# Patient Record
Sex: Female | Born: 1960 | Race: White | Hispanic: No | Marital: Married | Smoking: Current every day smoker
Health system: Southern US, Community
[De-identification: ages and names within clinical notes are randomized; demographics above are authoritative.]

## PROBLEM LIST (undated history)

## (undated) DIAGNOSIS — Z789 Other specified health status: Secondary | ICD-10-CM

---

## 1998-05-06 ENCOUNTER — Other Ambulatory Visit: Admission: RE | Admit: 1998-05-06 | Discharge: 1998-05-06 | Payer: Self-pay | Admitting: Family Medicine

## 2000-02-10 ENCOUNTER — Emergency Department (HOSPITAL_COMMUNITY): Admission: EM | Admit: 2000-02-10 | Discharge: 2000-02-10 | Payer: Self-pay | Admitting: Emergency Medicine

## 2000-02-10 ENCOUNTER — Encounter: Payer: Self-pay | Admitting: Emergency Medicine

## 2000-04-14 ENCOUNTER — Emergency Department (HOSPITAL_COMMUNITY): Admission: EM | Admit: 2000-04-14 | Discharge: 2000-04-14 | Payer: Self-pay | Admitting: Emergency Medicine

## 2000-04-14 ENCOUNTER — Encounter: Payer: Self-pay | Admitting: Emergency Medicine

## 2002-02-01 ENCOUNTER — Encounter: Payer: Self-pay | Admitting: Family Medicine

## 2002-02-01 ENCOUNTER — Encounter: Admission: RE | Admit: 2002-02-01 | Discharge: 2002-02-01 | Payer: Self-pay | Admitting: Family Medicine

## 2004-09-30 ENCOUNTER — Emergency Department (HOSPITAL_COMMUNITY): Admission: EM | Admit: 2004-09-30 | Discharge: 2004-09-30 | Payer: Self-pay | Admitting: Emergency Medicine

## 2006-09-27 ENCOUNTER — Emergency Department (HOSPITAL_COMMUNITY): Admission: EM | Admit: 2006-09-27 | Discharge: 2006-09-27 | Payer: Self-pay | Admitting: Emergency Medicine

## 2006-10-12 ENCOUNTER — Ambulatory Visit: Payer: Self-pay | Admitting: General Practice

## 2007-01-05 ENCOUNTER — Encounter: Admission: RE | Admit: 2007-01-05 | Discharge: 2007-01-05 | Payer: Self-pay | Admitting: Occupational Medicine

## 2007-09-07 ENCOUNTER — Other Ambulatory Visit: Admission: RE | Admit: 2007-09-07 | Discharge: 2007-09-07 | Payer: Self-pay | Admitting: Family Medicine

## 2007-10-13 IMAGING — CR DG FOOT COMPLETE 3+V*R*
3 series · 3 of 3 positions shown · non-contrast
Comparison: None.

CLINICAL DATA: Pain in the plantar aspect of the first metatarsal after pushing hard on a pedal. 
 RIGHT FOOT ? 3 VIEW:

[view not recorded (1 of 3)]
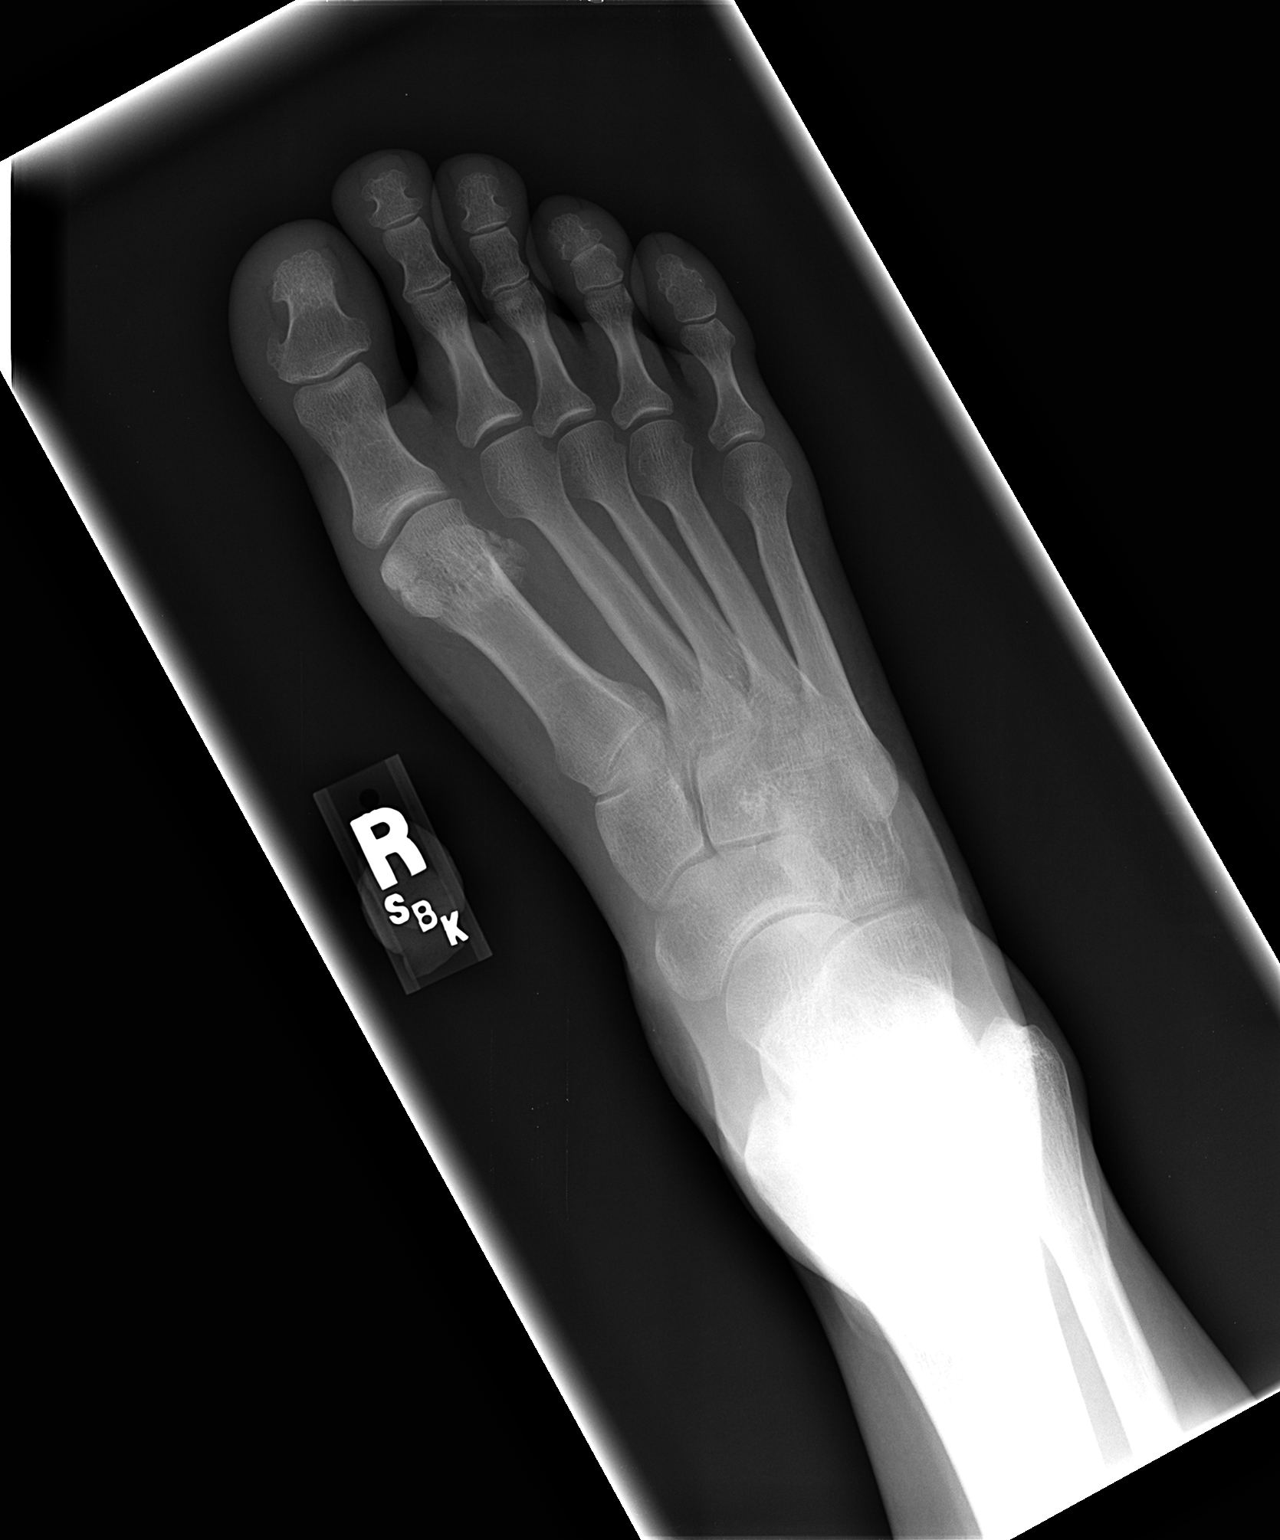

[view not recorded (2 of 3)]
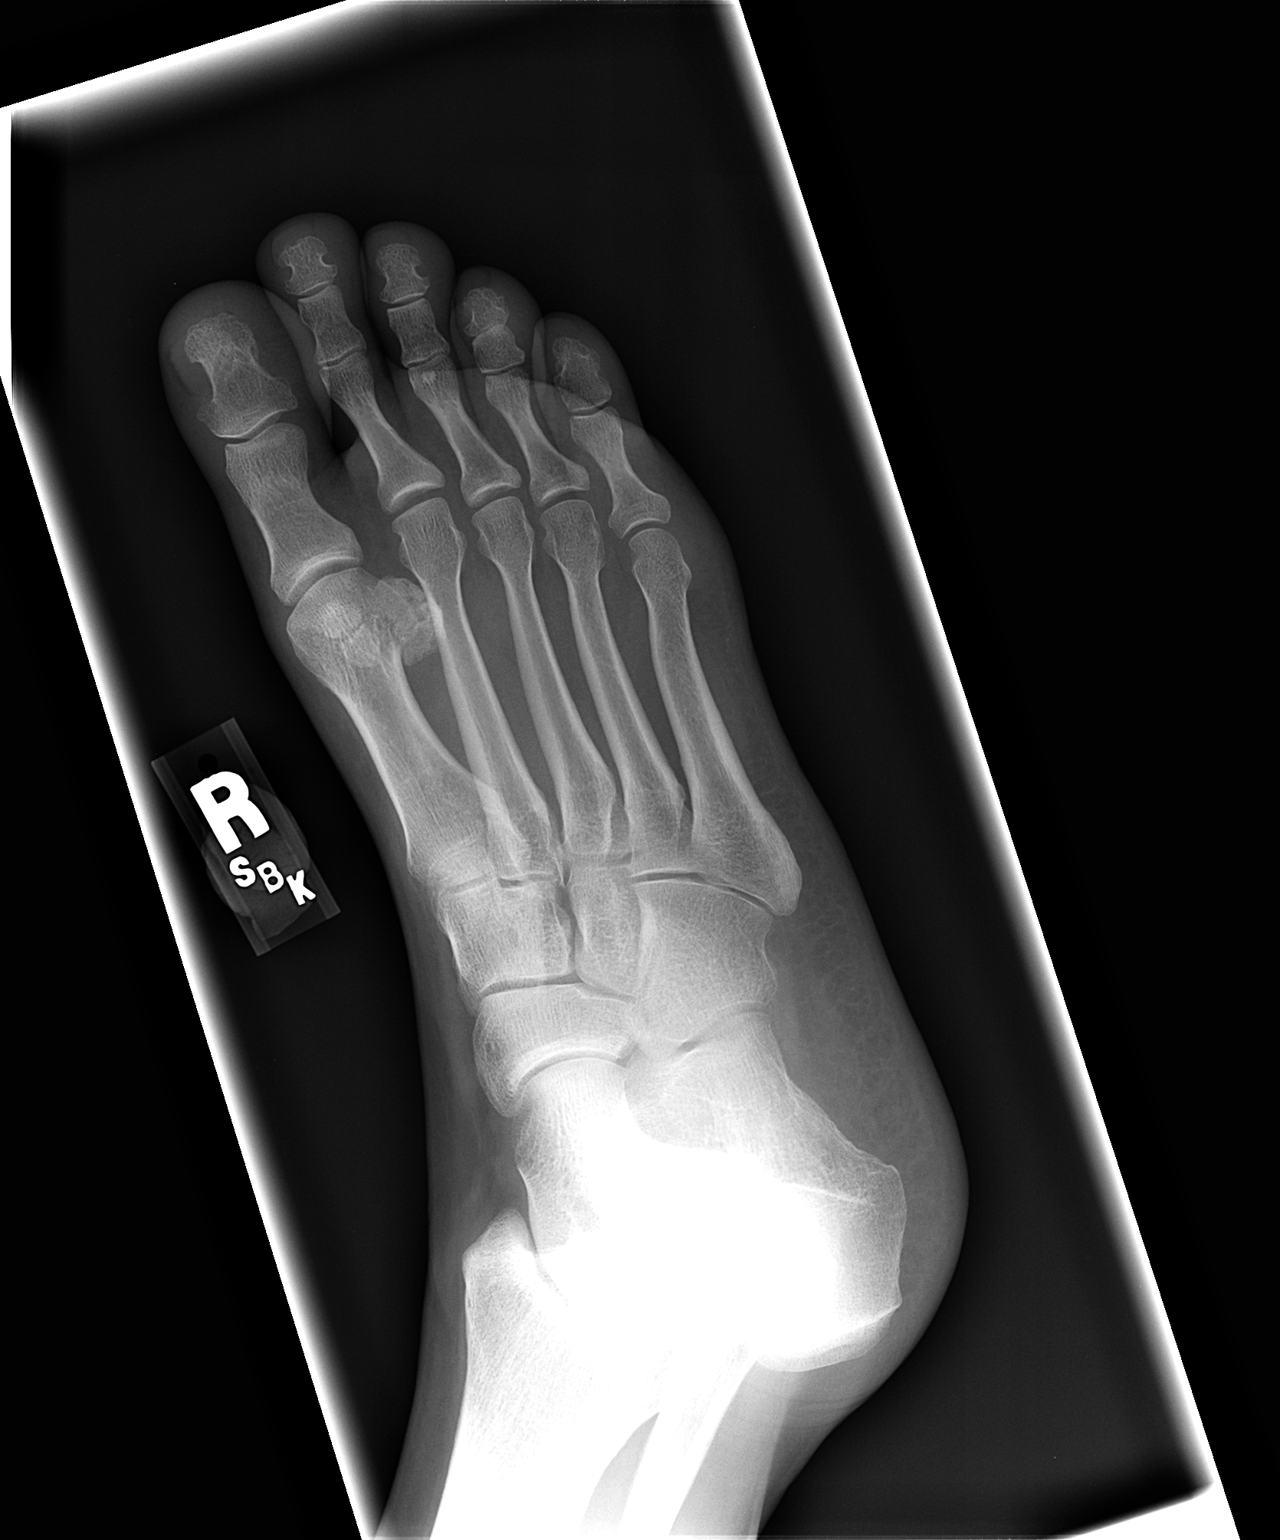

[view not recorded (3 of 3)]
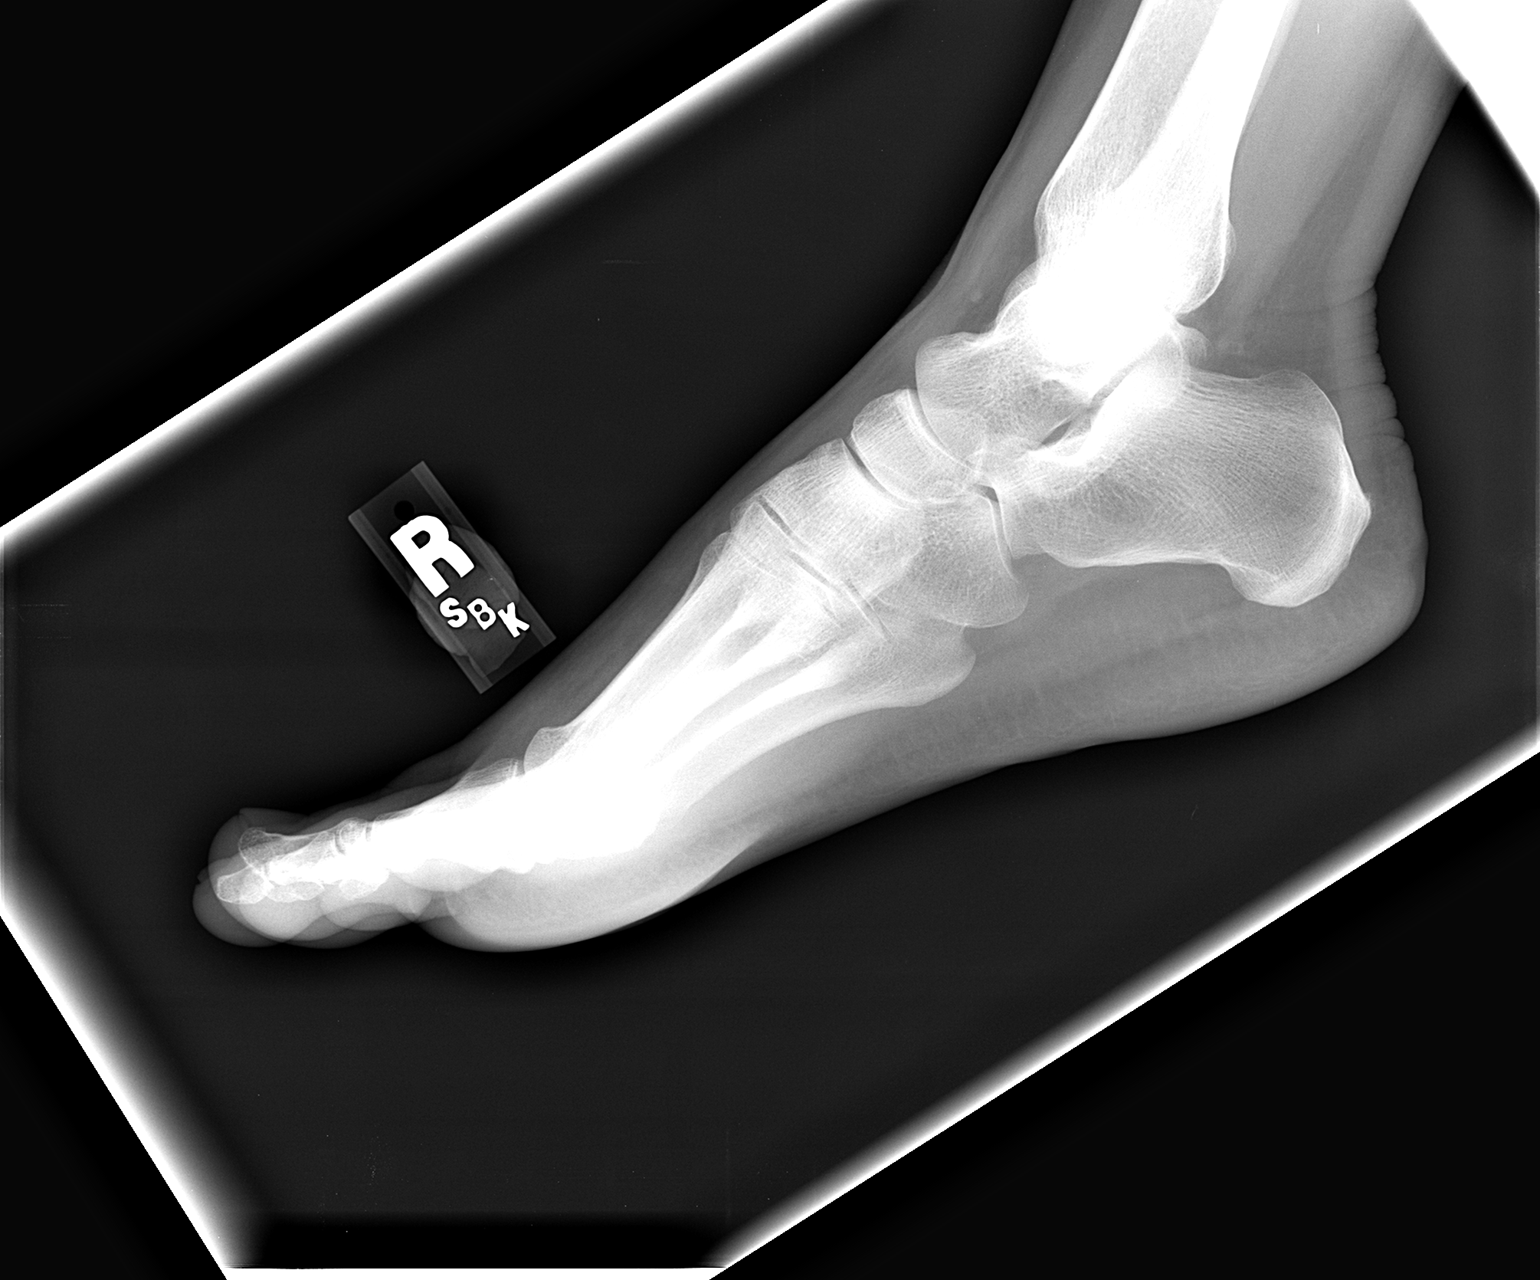

[3 of 3 positions shown; findings below may reference images not displayed]

FINDINGS: No fracture or acute abnormality.  The sesamoid bones at the base of the first metatarsal are somewhat fragments and more prominent than usual, but this is a variant of normal.
IMPRESSION: No acute findings ? see report.

## 2007-10-17 ENCOUNTER — Ambulatory Visit: Payer: Self-pay | Admitting: General Practice

## 2007-10-20 ENCOUNTER — Ambulatory Visit: Payer: Self-pay | Admitting: General Practice

## 2008-01-30 ENCOUNTER — Encounter: Admission: RE | Admit: 2008-01-30 | Discharge: 2008-01-30 | Payer: Self-pay | Admitting: Family Medicine

## 2008-06-24 ENCOUNTER — Ambulatory Visit: Payer: Self-pay | Admitting: General Practice

## 2009-08-20 ENCOUNTER — Emergency Department (HOSPITAL_COMMUNITY): Admission: EM | Admit: 2009-08-20 | Discharge: 2009-08-20 | Payer: Self-pay | Admitting: Family Medicine

## 2009-10-28 ENCOUNTER — Ambulatory Visit: Payer: Self-pay | Admitting: General Practice

## 2012-08-13 DIAGNOSIS — R4789 Other speech disturbances: Secondary | ICD-10-CM | POA: Insufficient documentation

## 2012-08-13 DIAGNOSIS — I498 Other specified cardiac arrhythmias: Secondary | ICD-10-CM | POA: Insufficient documentation

## 2012-08-13 DIAGNOSIS — F17200 Nicotine dependence, unspecified, uncomplicated: Secondary | ICD-10-CM | POA: Insufficient documentation

## 2012-08-13 DIAGNOSIS — I6350 Cerebral infarction due to unspecified occlusion or stenosis of unspecified cerebral artery: Principal | ICD-10-CM | POA: Insufficient documentation

## 2012-08-13 DIAGNOSIS — E119 Type 2 diabetes mellitus without complications: Secondary | ICD-10-CM | POA: Insufficient documentation

## 2012-08-13 DIAGNOSIS — I959 Hypotension, unspecified: Secondary | ICD-10-CM | POA: Insufficient documentation

## 2012-08-14 ENCOUNTER — Observation Stay (HOSPITAL_COMMUNITY)
Admission: EM | Admit: 2012-08-14 | Discharge: 2012-08-15 | DRG: 014 | Disposition: A | Discharge status: Home / Self Care | Payer: BC Managed Care – PPO | Attending: Family Medicine | Admitting: Family Medicine

## 2012-08-14 ENCOUNTER — Emergency Department (HOSPITAL_COMMUNITY): Payer: BC Managed Care – PPO

## 2012-08-14 ENCOUNTER — Encounter (HOSPITAL_COMMUNITY): Payer: Self-pay | Admitting: *Deleted

## 2012-08-14 ENCOUNTER — Observation Stay (HOSPITAL_COMMUNITY): Payer: BC Managed Care – PPO

## 2012-08-14 DIAGNOSIS — R001 Bradycardia, unspecified: Secondary | ICD-10-CM

## 2012-08-14 DIAGNOSIS — E119 Type 2 diabetes mellitus without complications: Secondary | ICD-10-CM

## 2012-08-14 DIAGNOSIS — I639 Cerebral infarction, unspecified: Secondary | ICD-10-CM

## 2012-08-14 DIAGNOSIS — I951 Orthostatic hypotension: Secondary | ICD-10-CM

## 2012-08-14 DIAGNOSIS — I6350 Cerebral infarction due to unspecified occlusion or stenosis of unspecified cerebral artery: Principal | ICD-10-CM

## 2012-08-14 DIAGNOSIS — G459 Transient cerebral ischemic attack, unspecified: Secondary | ICD-10-CM

## 2012-08-14 DIAGNOSIS — F17200 Nicotine dependence, unspecified, uncomplicated: Secondary | ICD-10-CM

## 2012-08-14 HISTORY — DX: Other specified health status: Z78.9

## 2012-08-14 LAB — LIPID PANEL
HDL: 58 mg/dL (ref >39)
LDL Cholesterol: 68 mg/dL (ref 0–99)
Total CHOL/HDL Ratio: 2.7 RATIO
Triglycerides: 148 mg/dL (ref <150)
VLDL: 30 mg/dL (ref 0–40)

## 2012-08-14 LAB — COMPREHENSIVE METABOLIC PANEL
ALT: 16 U/L (ref 0–35)
AST: 13 U/L (ref 0–37)
Albumin: 3.6 g/dL (ref 3.5–5.2)
Alkaline Phosphatase: 69 U/L (ref 39–117)
BUN: 16 mg/dL (ref 6–23)
CO2: 25 mEq/L (ref 19–32)
Calcium: 9.8 mg/dL (ref 8.4–10.5)
Chloride: 106 mEq/L (ref 96–112)
Creatinine, Ser: 0.57 mg/dL (ref 0.50–1.10)
GFR calc Af Amer: >90 mL/min (ref >90)
GFR calc non Af Amer: >90 mL/min (ref >90)
Glucose, Bld: 287 mg/dL — ABNORMAL HIGH (ref 70–99)
Potassium: 3.6 mEq/L (ref 3.5–5.1)
Sodium: 141 mEq/L (ref 135–145)
Total Bilirubin: 0.1 mg/dL — ABNORMAL LOW (ref 0.3–1.2)
Total Protein: 6.5 g/dL (ref 6.0–8.3)

## 2012-08-14 LAB — CBC
HCT: 38.7 % (ref 36.0–46.0)
HCT: 41 % (ref 36.0–46.0)
Hemoglobin: 13.3 g/dL (ref 12.0–15.0)
MCH: 30 pg (ref 26.0–34.0)
MCH: 29.7 pg (ref 26.0–34.0)
MCH: 29.2 pg (ref 26.0–34.0)
MCHC: 34.4 g/dL (ref 30.0–36.0)
MCHC: 33.9 g/dL (ref 30.0–36.0)
MCHC: 33.4 g/dL (ref 30.0–36.0)
MCV: 87.2 fL (ref 78.0–100.0)
MCV: 87.8 fL (ref 78.0–100.0)
Platelets: 257 10*3/uL (ref 150–400)
Platelets: 249 10*3/uL (ref 150–400)
RBC: 4.44 MIL/uL (ref 3.87–5.11)
RDW: 12.4 % (ref 11.5–15.5)
RDW: 12.6 % (ref 11.5–15.5)
WBC: 9.7 10*3/uL (ref 4.0–10.5)

## 2012-08-14 LAB — GLUCOSE, CAPILLARY
Glucose-Capillary: 216 mg/dL — ABNORMAL HIGH (ref 70–99)
Glucose-Capillary: 141 mg/dL — ABNORMAL HIGH (ref 70–99)
Glucose-Capillary: 284 mg/dL — ABNORMAL HIGH (ref 70–99)

## 2012-08-14 LAB — PROTIME-INR
INR: 0.91 (ref 0.00–1.49)
Prothrombin Time: 12.4 seconds (ref 11.6–15.2)

## 2012-08-14 LAB — CREATININE, SERUM: Creatinine, Ser: 0.61 mg/dL (ref 0.50–1.10)

## 2012-08-14 LAB — HEMOGLOBIN A1C: Hgb A1c MFr Bld: 8.7 % — ABNORMAL HIGH (ref <5.7)

## 2012-08-14 LAB — TROPONIN I: Troponin I: <0.3 ng/mL (ref <0.30)

## 2012-08-14 MED ORDER — ONDANSETRON HCL 4 MG/2ML IJ SOLN: 4.0000 mg | Freq: Four times a day (QID) | INTRAMUSCULAR | Status: DC | PRN

## 2012-08-14 MED ORDER — ACETAMINOPHEN 325 MG PO TABS: 650.0000 mg | ORAL_TABLET | Freq: Four times a day (QID) | ORAL | Status: DC | PRN

## 2012-08-14 MED ORDER — ASPIRIN 81 MG PO CHEW
81.0000 mg | CHEWABLE_TABLET | Freq: Once | ORAL | Status: AC
  Administered 2012-08-14: 81 mg via ORAL
  Filled 2012-08-14: qty 1

## 2012-08-14 MED ORDER — INSULIN ASPART 100 UNIT/ML ~~LOC~~ SOLN
0.0000 [IU] | Freq: Three times a day (TID) | SUBCUTANEOUS | Status: DC
  Administered 2012-08-15: 1 [IU] via SUBCUTANEOUS
  Administered 2012-08-15: 2 [IU] via SUBCUTANEOUS

## 2012-08-14 MED ORDER — SODIUM CHLORIDE 0.9 % IV SOLN: 250.0000 mL | INTRAVENOUS | Status: DC | PRN

## 2012-08-14 MED ORDER — SODIUM CHLORIDE 0.9 % IJ SOLN
3.0000 mL | Freq: Two times a day (BID) | INTRAMUSCULAR | Status: DC
  Administered 2012-08-15: 3 mL via INTRAVENOUS

## 2012-08-14 MED ORDER — METFORMIN HCL 500 MG PO TABS
500.0000 mg | ORAL_TABLET | Freq: Two times a day (BID) | ORAL | Status: DC
  Administered 2012-08-15 (×2): 500 mg via ORAL
  Filled 2012-08-14 (×4): qty 1

## 2012-08-14 MED ORDER — ASPIRIN 81 MG PO CHEW
81.0000 mg | CHEWABLE_TABLET | Freq: Every day | ORAL | Status: DC
  Administered 2012-08-14 – 2012-08-15 (×2): 81 mg via ORAL
  Filled 2012-08-14 (×2): qty 1

## 2012-08-14 MED ORDER — ASPIRIN EC 81 MG PO TBEC
81.0000 mg | DELAYED_RELEASE_TABLET | Freq: Every day | ORAL | Status: DC
  Filled 2012-08-14: qty 1

## 2012-08-14 MED ORDER — SODIUM CHLORIDE 0.9 % IJ SOLN: 3.0000 mL | INTRAMUSCULAR | Status: DC | PRN

## 2012-08-14 MED ORDER — SENNOSIDES-DOCUSATE SODIUM 8.6-50 MG PO TABS: 1.0000 | ORAL_TABLET | Freq: Every evening | ORAL | Status: DC | PRN

## 2012-08-14 MED ORDER — ENOXAPARIN SODIUM 40 MG/0.4ML ~~LOC~~ SOLN
40.0000 mg | SUBCUTANEOUS | Status: DC
  Administered 2012-08-14: 40 mg via SUBCUTANEOUS
  Filled 2012-08-14 (×2): qty 0.4

## 2012-08-14 MED ORDER — ASPIRIN 81 MG PO CHEW
324.0000 mg | CHEWABLE_TABLET | Freq: Once | ORAL | Status: DC
Start: 2012-08-14 — End: 2012-08-14

## 2012-08-14 MED ORDER — ENOXAPARIN SODIUM 40 MG/0.4ML ~~LOC~~ SOLN
40.0000 mg | SUBCUTANEOUS | Status: DC
  Administered 2012-08-14 – 2012-08-15 (×2): 40 mg via SUBCUTANEOUS
  Filled 2012-08-14 (×2): qty 0.4

## 2012-08-14 NOTE — Consult Note (Signed)
Chief Complaint: Stroke  HPI: Stephanie Fuller is a 51 y.o. RH  Female originally from [place], on Sunday  Noticed she noted weakness on the right UE. That night her husband noted dysarthria and brought patient to ED.  Since admission patients BP has been systolic in 130's, and HR in 48-54 and asymptomatic. 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  Patient denied any chest pain, no sob, no sensory deficits, no dysphagia, no diplopia  Presently she is complaining of right arm and leg weakness but no sensory changes. Husband states her speech has improved but is not back to baseline. She does smoke 1.5 PPD.    LSN: Sunday tPA Given: No: out of window  History reviewed. No pertinent past medical history.  Past Surgical History  Procedure Date  . Cesarean section     History reviewed. No pertinent family history. Social History:  reports that she has been smoking.  She does smoke tobacco (1.5 PPD). She reports that she drinks about .6 ounces of alcohol per week. Her drug history not on file.  Family history: Father died from Hypertensive Intracerebral bleed and mother died from Cardiac arrest Allergies: No Known Allergies  Medications: Current Facility-Administered Medications  Medication Dose Route Frequency Provider Last Rate Last Dose  . aspirin chewable tablet 324 mg  324 mg Oral Once [employer], PA-C      . aspirin EC tablet 81 mg  81 mg Oral Daily Felicie Morn, PA      . enoxaparin (LOVENOX) injection 40 mg  40 mg Subcutaneous Q24H Tobin Chad, MD   40 mg at 08/14/12 1020   Current Outpatient Prescriptions  Medication Sig Dispense Refill  . CALCIUM CARBONATE PO Take 1 tablet by mouth daily.      . Multiple Vitamins-Minerals (MULTI-VITAMIN GUMMIES) CHEW Chew 1 tablet by mouth daily.      . naproxen sodium (ANAPROX) 220 MG  tablet Take 220 mg by mouth daily as needed. For pain       ROS: History obtained from the patient  General ROS: negative for - chills, fatigue, fever, night sweats, weight gain or weight loss Psychological ROS: negative for - behavioral disorder, hallucinations, memory difficulties, mood swings or suicidal ideation Ophthalmic ROS: negative for - blurry vision, double vision, eye pain or loss of vision ENT ROS: negative for - epistaxis, nasal discharge, oral lesions, sore throat, tinnitus or vertigo Allergy and Immunology ROS: negative for - hives or itchy/watery eyes Hematological and Lymphatic ROS: negative for - bleeding problems, bruising or swollen lymph nodes Endocrine ROS: negative for - galactorrhea, hair pattern changes, polydipsia/polyuria or temperature intolerance Respiratory ROS: negative for - cough, hemoptysis, shortness of breath or wheezing Cardiovascular ROS: negative for - chest pain, dyspnea on exertion, edema or irregular heartbeat Gastrointestinal ROS: negative for - abdominal pain, diarrhea, hematemesis, nausea/vomiting or stool incontinence Genito-Urinary ROS: negative for - dysuria, hematuria, incontinence or urinary frequency/urgency Musculoskeletal ROS: negative for - joint swelling or muscular weakness Neurological ROS: as noted in HPI Dermatological ROS: negative for rash and skin lesion changes   Physical Examination: Blood pressure 119/87, pulse 55, temperature 98.1 F (36.7 C), temperature source Oral, resp. rate 16, SpO2 100.00%.  Neurologic Examination: Mental Status: Alert, oriented X 3.  Speech fluent without evidence of aphasia but per husband continues to have dysarthria. Able to follow 3 step commands without  difficulty. Mood:concerned Memory:Intact Thought content appropriate Cranial Nerves: II-Visual fields grossly intact. III/IV/VI-Extraocular movements intact.  Pupils reactive bilaterally. Ptosis not present. V/VII-Smile symmetric with  slight resting NL fold decrease VIII-grossly intact IX/X-normal gag XI-bilateral shoulder shrug XII-midline tongue extension Motor: 5/5 on left arm and leg,  Right: UE : Proximal  4+/5 and distal 5-/5             LE: proximal 4+ and distal is 5-/5 , Right leg drift   Sensory: Pinprick and light touch and vibration  intact throughout, bilaterally Deep Tendon Reflexes:  Right: Upper Extremity   Left: Upper extremity   biceps (C-5 to C-6) 2/4   biceps (C-5 to C-6) 2/4 tricep (C7) 2/4    triceps (C7) 2/4 Brachioradialis (C6) 2/4  Brachioradialis (C6) 2/4  Lower Extremity Lower Extremity  quadriceps (L-2 to L-4) 2/4   quadriceps (L-2 to L-4) 2/4 Achilles (S1) 1/4   Achilles (S1) 1/4      Plantars:      Right:  downgoing     Left:  Downgoing Cerebellar: Normal finger-to-nose and normal heel-to-shin test.      Laboratory Studies:  Basic Metabolic Panel:  Lab 08/14/12 8657 08/14/12 0235  NA -- 141  K -- 3.6  CL -- 106  CO2 -- 25  GLUCOSE -- 287*  BUN -- 16  CREATININE 0.61 0.57  CALCIUM -- 9.8  MG -- --  PHOS -- --    Liver Function Tests:  Lab 08/14/12 0235  AST 13  ALT 16  ALKPHOS 69  BILITOT 0.1*  PROT 6.5  ALBUMIN 3.6   No results found for this basename: LIPASE:5,AMYLASE:5 in the last 168 hours No results found for this basename: AMMONIA:3 in the last 168 hours  CBC:  Lab 08/14/12 0712 08/14/12 0235  WBC 8.6 9.7  NEUTROABS -- --  HGB 13.1 13.3  HCT 38.7 38.7  MCV 87.8 87.2  PLT 249 257    Cardiac Enzymes:  Lab 08/14/12 0235  CKTOTAL --  CKMB --  CKMBINDEX --  TROPONINI <0.30    BNP: No components found with this basename: POCBNP:5  CBG:  Lab 08/14/12 0438  GLUCAP 216*    Microbiology: No results found for this or any previous visit.  Coagulation Studies:  Basename 08/14/12 0235  LABPROT 12.4  INR 0.91    Urinalysis: No results found for this basename:  COLORURINE:2,APPERANCEUR:2,LABSPEC:2,PHURINE:2,GLUCOSEU:2,HGBUR:2,BILIRUBINUR:2,KETONESUR:2,PROTEINUR:2,UROBILINOGEN:2,NITRITE:2,LEUKOCYTESUR:2 in the last 168 hours  Lipid Panel:    Component Value Date/Time   CHOL 156 08/14/2012 0712   TRIG 148 08/14/2012 0712   HDL 58 08/14/2012 0712   CHOLHDL 2.7 08/14/2012 0712   VLDL 30 08/14/2012 0712   LDLCALC 68 08/14/2012 0712    HgbA1C:  No results found for this basename: HGBA1C    Urine Drug Screen:   No results found for this basename: labopia,  cocainscrnur,  labbenz,  amphetmu,  thcu,  labbarb    Alcohol Level: No results found for this basename: ETH:2 in the last 168 hours  Other results: EKG: normal EKG, normal sinus rhythm.  Imaging: Dg Chest 2 View  08/14/2012  *RADIOLOGY REPORT*  Clinical Data: Left shoulder pain and dizziness.  CHEST - 2 VIEW  Comparison: 01/30/2008  Findings: Normal heart size and pulmonary vascularity.  This changes in the chest.  No focal airspace consolidation.  No blunting of costophrenic angles.  No pneumothorax.  Mediastinal contours appear intact.  No significant change since previous study.  IMPRESSION: Emphysematous changes.  No evidence of  active pulmonary disease.  Original Report Authenticated By: Marlon Pel, M.D.   Ct Head Wo Contrast  08/14/2012  *RADIOLOGY REPORT*  Clinical Data: A aphasia.  Left arm pain and paresthesias. Dizziness.  CT HEAD WITHOUT CONTRAST  Technique:  Contiguous axial images were obtained from the base of the skull through the vertex without contrast.  Comparison: CT head 09/30/2004.  MRI brain 09/30/2004.  Findings: Mild cerebral atrophy.  No mass effect or midline shift. No abnormal extra-axial fluid collections.  Gray-white matter junctions are distinct.  Basal cisterns are not effaced.  No evidence of acute intracranial hemorrhage.  Mucous membrane thickening in the paranasal sinuses with retention cyst in the sphenoid sinus.  Mastoid air cells are not opacified.  No  depressed skull fractures.  IMPRESSION: No acute intracranial abnormalities.  Probable inflammatory changes in the paranasal sinuses.  Original Report Authenticated By: Marlon Pel, M.D.   Mr Brain Wo Contrast  08/14/2012  *RADIOLOGY REPORT*  Clinical Data:  Aphasia.  Left arm pain with dizziness.  MRI HEAD WITHOUT CONTRAST MRA HEAD WITHOUT CONTRAST  Technique:  Multiplanar, multiecho pulse sequences of the brain and surrounding structures were obtained without intravenous contrast. Angiographic images of the head were obtained using MRA technique without contrast.  Comparison:  CT head 08/14/2012.  MRI HEAD  Findings:  6 x 17 mm area of acute infarction affects the posterior limb internal capsule and basal ganglia on the left, as well as a portion of the dorsal lateral thalamus.  No associated hemorrhage or mass effect.  No mass lesion is seen.  There is no atrophy or significant white matter disease.  There is no midline shift.  The major intracranial vascular structures are patent.  There is chronic mucosal thickening throughout the paranasal sinuses with an air-fluid level in the left sphenoid division.  Calvarium and skull base intact.  Negative upper cervical region.  Pituitary and cerebellar tonsils unremarkable. Grossly negative orbits.  Compared with prior CT, the infarct is not visible.  IMPRESSION: Acute left hemisphere deep white matter infarction affects primarily the posterior limb internal capsule as well as portions of the surrounding basal ganglia and thalamus.  No hemorrhage or mass effect.  MRA HEAD  Findings: Widely patent internal carotid arteries.  Widely patent basilar artery.  Right greater than left vertebral arteries contribute formation of the basilar.  There is no proximal intracranial stenosis or aneurysm.  IMPRESSION: Negative MRA intracranial circulation.   Original Report Authenticated By: Elsie Stain, M.D. ( 08/14/2012 10:04:25 )    Mr Maxine Glenn Head/brain Wo Cm  08/14/2012   *RADIOLOGY REPORT*  Clinical Data:  Aphasia.  Left arm pain with dizziness.  MRI HEAD WITHOUT CONTRAST MRA HEAD WITHOUT CONTRAST  Technique:  Multiplanar, multiecho pulse sequences of the brain and surrounding structures were obtained without intravenous contrast. Angiographic images of the head were obtained using MRA technique without contrast.  Comparison:  CT head 08/14/2012.  MRI HEAD  Findings:  6 x 17 mm area of acute infarction affects the posterior limb internal capsule and basal ganglia on the left, as well as a portion of the dorsal lateral thalamus.  No associated hemorrhage or mass effect.  No mass lesion is seen.  There is no atrophy or significant white matter disease.  There is no midline shift.  The major intracranial vascular structures are patent.  There is chronic mucosal thickening throughout the paranasal sinuses with an air-fluid level in the left sphenoid division.  Calvarium and skull base  intact.  Negative upper cervical region.  Pituitary and cerebellar tonsils unremarkable. Grossly negative orbits.  Compared with prior CT, the infarct is not visible.  IMPRESSION: Acute left hemisphere deep white matter infarction affects primarily the posterior limb internal capsule as well as portions of the surrounding basal ganglia and thalamus.  No hemorrhage or mass effect.  MRA HEAD  Findings: Widely patent internal carotid arteries.  Widely patent basilar artery.  Right greater than left vertebral arteries contribute formation of the basilar.  There is no proximal intracranial stenosis or aneurysm.  IMPRESSION: Negative MRA intracranial circulation.   Original Report Authenticated By: Elsie Stain, M.D. ( 08/14/2012 10:04:24 )    CD show no ICA stenosis  Assessment: 51 y.o. 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 Likely etiology is small vessel  given risk factors of smoking and possible diabetes. Patient BP has remained in 130's and HR is bradycardic but she is asymptomatic Her symptoms are minimal No tPA was given because of the symptoms were so minimal and outside the tPA window.    Stroke Risk Factors - smoking  Plan: 1. HgbA1c, 2. PT consult, Speech consult prior to D/C 3. Echocardiogram 4. Prophylactic therapy-Antiplatelet med: Aspirin - dose 81 mg daily 7. Risk factor modification 8. Telemetry monitoring 9. Frequent neuro checks 10. Smoking cessation 11. Do not treat BP unless >210 systolic 12. Treat HbA1c if needed when resulted.  13. Orthostatic BP to confirm no symptoms from bradycardia.   Felicie Morn PA-C [facility] [phone]  08/14/2012, 12:04 PM     Patient is independently interviewed, examined and obtained history. Above note has been edited. i agree with the plan.  Patient  May have new onset of diabetes  Counseling: Advised patient to take ASA and quit smoking Most of the stroke work up is done and she needs Echo  In addition to outpatient Neurology follow up  Hussein Macdougal V-P Eilleen Kempf., MD., Ph.D.,MS 08/14/2012 12:33 PM

## 2012-08-14 NOTE — H&P (Signed)
Seen and examined.  Discussed with Dr. Fara Boros.  Agree with her documentation and management.  Briefly, 51 yo female with near syncope and difficulty with speech.  Issues: 1. Acute CVA.  Risk factors are tobacco use and uncontrolled DM.  Currently only affecting speech.   2. Diabetes Type 2 new onset.  She is previously undiagnosed and suspect this has been going on for a while. 3. Tobacco abuse. 4. Borderline hypotension and bradycardia.  CNS problems can cause cardiac arrythmias.  Also, will get echocardiogram as part of our CVA workup.  Still, it is odd to see hypotension in the setting of an acute CVA.  Will follow both heart rate and BP.  No intervention for now.

## 2012-08-14 NOTE — ED Provider Notes (Signed)
History     CSN: [id number]  Arrival date & time 08/13/12  2359   First MD Initiated Contact with Patient 08/14/12 0206      Chief Complaint  Patient presents with  . Arm Pain  . Dizziness    (Consider location/radiation/quality/duration/timing/severity/associated sxs/prior treatment) HPI Comments: Mrs. Berryman presents ambulatory with her husband for evaluation of paresthesias.  She states she felt ill after being enclosed in the bathroom for 30 minutes while using a strong cleaning detergent.  She initially described having paresthesias (tingling in her right arm and bilat legs).  Her daughter states that when she came out of the bathroom she initially appeared confused.  She then was unable to speak clearly.  She states her words were slurred and garbled.  She states her mother appeared frustrated and she had a droop along the left corner of her mouth.  Within 2-3 hours her speech improved but she continued to c/o tingling/itching in her left arm and hand.  Her husband returned home and she had another episode of abnormal speech that again has improved.  She denies any fever, HA, visual changes, difficulty swallowing, CP, SOB, palpitations, vomiting, diarrhea, one-sided weakness.  Her family states she did appear dizzy and off balanced.  She reports mild nausea initially that has since resolved.  Patient is a 51 y.o. female presenting with arm pain and weakness. The history is provided by the patient, the spouse and a relative (daughter). No language interpreter was used.  Arm Pain This is a new problem. The current episode started 12 to 24 hours ago (s/s onset at noon (8/18)). The problem has been gradually improving. Pertinent negatives include no chest pain, no abdominal pain, no headaches and no shortness of breath. Nothing aggravates the symptoms. Nothing relieves the symptoms.  Weakness The primary symptoms include paresthesias, focal weakness, loss of sensation and speech change.  Primary symptoms do not include headaches, syncope, loss of consciousness, altered mental status, seizures, dizziness, visual change, memory loss, fever, nausea or vomiting. The symptoms began 12 to 24 hours ago (noon). The symptoms are improving. The neurological symptoms are focal. Context: onset after cleaning her bathroom with a strong detergent.  The weakness is improving. There is weakness in these regions/motions: facial muscles. There is impairment of the following actions: articulating words and shrugging shoulders. There is no impairment of the following actions: moving eyes, closing eyes, chewing, swallowing, rotating head, sticking tongue out, reaching upwards, holding things, buttoning, opening a bottle, standing up, walking, going down stairs or going up stairs.  Features of the speech change include inability to articulate and inability to speak fluently.  Additional symptoms include weakness. Additional symptoms do not include neck stiffness, leg pain, loss of balance, photophobia, hallucinations, nystagmus, hearing loss, vertigo or dysphoric mood. Medical issues do not include seizures, cancer, alcohol use, drug use, hypertension or recent surgery.    History reviewed. No pertinent past medical history.  Past Surgical History  Procedure Date  . Cesarean section     History reviewed. No pertinent family history.  History  Substance Use Topics  . Smoking status: Current Everyday Smoker  . Smokeless tobacco: Not on file  . Alcohol Use: 0.6 oz/week    1 Glasses of wine per week    OB History    Grav Para Term Preterm Abortions TAB SAB Ect Mult Living                  Review of Systems  Constitutional: Positive for  appetite change. Negative for fever, chills, diaphoresis, activity change and fatigue.  HENT: Negative for hearing loss, congestion, sore throat, facial swelling, rhinorrhea, drooling, trouble swallowing, neck pain, neck stiffness and voice change.   Eyes:  Negative for photophobia, pain and visual disturbance.  Respiratory: Negative for cough, choking, chest tightness and shortness of breath.   Cardiovascular: Negative for chest pain, palpitations, leg swelling and syncope.  Gastrointestinal: Negative for nausea, vomiting, abdominal pain, diarrhea and constipation.  Genitourinary: Negative.   Musculoskeletal: Positive for arthralgias. Negative for myalgias and back pain.       Left shoulder (chronic)  Skin: Negative for color change, pallor, rash and wound.  Neurological: Positive for speech change, focal weakness, speech difficulty, weakness, light-headedness, numbness and paresthesias. Negative for dizziness, vertigo, tremors, seizures, loss of consciousness, syncope, headaches and loss of balance.  Psychiatric/Behavioral: Positive for confusion. Negative for hallucinations, memory loss, dysphoric mood, decreased concentration, agitation and altered mental status. The patient is nervous/anxious.     Allergies  Review of patient's allergies indicates no known allergies.  Home Medications   Current Outpatient Rx  Name Route Sig Dispense Refill  . CALCIUM CARBONATE PO Oral Take 1 tablet by mouth daily.    . MULTI-VITAMIN GUMMIES PO CHEW Oral Chew 1 tablet by mouth daily.    Marland Kitchen NAPROXEN SODIUM 220 MG PO TABS Oral Take 220 mg by mouth daily as needed. For pain      BP 142/90  Temp 98.4 F (36.9 C) (Oral)  Resp 20  SpO2 98%  Physical Exam  Nursing note and vitals reviewed. Constitutional: She is oriented to person, place, and time. She appears well-developed and well-nourished. No distress.  HENT:  Head: Normocephalic and atraumatic.  Right Ear: External ear normal.  Left Ear: External ear normal.  Nose: Nose normal.  Mouth/Throat: Oropharynx is clear and moist. No oropharyngeal exudate.  Eyes: Conjunctivae and EOM are normal. Pupils are equal, round, and reactive to light. Right eye exhibits no discharge. Left eye exhibits no  discharge. No scleral icterus.  Neck: Normal range of motion. Neck supple. No JVD present. No tracheal deviation present. No thyromegaly present.  Cardiovascular: Normal rate, regular rhythm, normal heart sounds and intact distal pulses.  Exam reveals no gallop and no friction rub.   No murmur heard. Pulmonary/Chest: Effort normal and breath sounds normal. No stridor. No respiratory distress. She has no wheezes. She has no rales. She exhibits no tenderness.  Abdominal: Soft. Bowel sounds are normal. She exhibits no distension and no mass. There is no tenderness. There is no rebound and no guarding.  Musculoskeletal: Normal range of motion. She exhibits no edema and no tenderness.  Lymphadenopathy:    She has no cervical adenopathy.  Neurological: She is alert and oriented to person, place, and time. She has normal strength and normal reflexes. She displays no atrophy and no tremor. No cranial nerve deficit or sensory deficit. She exhibits normal muscle tone. She displays a negative Romberg sign. She displays no seizure activity. Coordination and gait normal. GCS eye subscore is 4. GCS verbal subscore is 5. GCS motor subscore is 6. She displays no Babinski's sign on the right side. She displays no Babinski's sign on the left side.       Nl finger to nose.  Skin: Skin is warm and dry. No rash noted. She is not diaphoretic. No erythema. No pallor.  Psychiatric: Her behavior is normal. Judgment and thought content normal. Her mood appears anxious. Her affect is not angry,  not blunt, not labile and not inappropriate. Her speech is not rapid and/or pressured. Cognition and memory are normal. Cognition and memory are not impaired. She does not exhibit a depressed mood.       Pt is originally from [place] and has a strong accent.  Her husband and daughter states that her speech currently sounds clear.    ED Course  Procedures (including critical care time)   Labs Reviewed  CBC  COMPREHENSIVE METABOLIC  PANEL  TROPONIN I  PROTIME-INR   No results found.   No diagnosis found.   Date: 08/14/2012  Rate: 49 bpm  Rhythm: sinus bradycardia  QRS Axis: normal  Intervals: normal  ST/T Wave abnormalities: normal  Conduction Disutrbances:none  Narrative Interpretation: note small inf q waves  Old EKG Reviewed: none available      MDM  Pt presents for evaluation ow paresthesias and abnormal speech.  She initially described left arm and bilat leg tingling, however her family adds that there was also an expressive aphasia.  Plan initiate a CVA eval including basic labs, ekg, and a head CT.  Will review the results as available and likely place pt on the ER stroke protocol.        Tobin Chad, MD 08/17/12 1755

## 2012-08-14 NOTE — ED Notes (Signed)
Patient is in vascular lab. Per mri staff pt is expected in their department in about 10 minutes. Will come to cdu after complete

## 2012-08-14 NOTE — ED Notes (Signed)
Neurology PA at the bedside. 

## 2012-08-14 NOTE — H&P (Signed)
Family Medicine Teaching [facility] Admission History and Physical Service Pager: [phone]  Patient name: Stephanie Fuller Medical record number: [id number] Date of birth: 28-Nov-1961 Age: 51 y.o. Gender: female  Primary Care Provider: No primary provider on file.  Chief Complaint: acute onset of slurred speech  Assessment and Plan:Stephanie Fuller is a 51 y.o. year old female presenting with new onset slurred speech, found to have an acute cerebral infarct on brain MRI. 1. CVA - Neuro was consulted in ED and has made recommendations. Allowing permissive HTN; do not treat unless > 210 systolic. Will initiate aspirin 81mg  daily. Have checked lipids, a1c, TSH, echo, dopplers, all of which are negative except A1c of 8.7 1. Needs PT and speech therapy evaluations 2. Diabetes - new diagnosis; Hgb A1c 8.7. Will start metformin now as renal function normal and patient did not get any CT contrast. SSI while in house as well. 3. Tobacco abuse - smoking cessation counseling 4. FEN/GI: KVO IV, pending swallow eval will start carb modified diet 5. Prophylaxis: Lovenox 6. Disposition: pending PT/OT/SLP evals 7. Code Status: full code  History of Present Illness: Stephanie Fuller is a 51 y.o. year old female presenting with acute onset of slurred speech. She has a hard time communicating the history due to speech problems (?both from CVA as well as language barrier as pt is originally from [place]), but what she is able to tell us is that prior to presenting she began to feel unwell and told her family she needed to go to the hospital; at that time her speech was slurred and her family had trouble understanding her. Before the onset of her speech problems, she felt well and nothing felt different than normal. She denies current weakness or numbness anywhere in her body. Denies being in pain anywhere at this time.  Does report dizziness off and on over the past 6 months, which she states resolves with eating.   Has never had any palpitations and has never been told she has an irregular or slow heart beat.  Did become dizzy with standing on exam.  Denies chest pain, shortness of breath, sore throat, headache, fevers, abdominal pain, changes in bowel or bladder, or weakness. Pt states she has not been to her primary care doctor in greater than one year. She has lived in [place] for the last 15 years. We were unable to understand her when she said the name of her PCP.  Past Medical History: History reviewed. No pertinent past medical history.  Past Surgical History: Past Surgical History  Procedure Date  . Cesarean section    Social History: History  Substance Use Topics  . Smoking status: Current Everyday Smoker    Types: Cigarettes  . Smokeless tobacco: Not on file   Comment: 4-5 cigarettes per day since age 22  . Alcohol Use: 0.6 oz/week    1 Glasses of wine per week   For any additional social history documentation, please refer to relevant sections of EMR.  Family History: History reviewed. No pertinent family history.  Allergies: No Known Allergies No current facility-administered medications on file prior to encounter.   Current Outpatient Prescriptions on File Prior to Encounter  Medication Sig Dispense Refill  . CALCIUM CARBONATE PO Take 1 tablet by mouth daily.       Review Of Systems: Per HPI. Otherwise 12 point review of systems was performed and was unremarkable.  Physical Exam: BP 100/70  Pulse 79  Temp 98.1 F (36.7 C) (Oral)  Resp 14  SpO2 98% Exam: General: NAD HEENT: moist mucous membranes Cardiovascular: RRR, no murmurs auscultated Respiratory: CTAB Abdomen: nontender to palpation, +BS Skin: no visible rashes Neuro: EOMI, PERRL, face symmetric, SCM 5/5 bilaterally, shoulder shrug 5/5 bilaterally, strength 5/5 throughout, speech slow and slurred, seems to have problems with word finding, alert and oriented x3, negative romberg, tongue protrudes  midline Psych: becomes tearful at end of interview, saying she wants to see her kids  Labs and Imaging: CBC:    Component Value Date/Time   WBC 8.6 08/14/2012 0712   HGB 13.1 08/14/2012 0712   HCT 38.7 08/14/2012 0712   PLT 249 08/14/2012 0712   MCV 87.8 08/14/2012 0712   Comprehensive Metabolic Panel:    Component Value Date/Time   NA 141 08/14/2012 0235   K 3.6 08/14/2012 0235   CL 106 08/14/2012 0235   CO2 25 08/14/2012 0235   BUN 16 08/14/2012 0235   CREATININE 0.61 08/14/2012 0712   GLUCOSE 287* 08/14/2012 0235   CALCIUM 9.8 08/14/2012 0235   AST 13 08/14/2012 0235   ALT 16 08/14/2012 0235   ALKPHOS 69 08/14/2012 0235   BILITOT 0.1* 08/14/2012 0235   PROT 6.5 08/14/2012 0235   ALBUMIN 3.6 08/14/2012 0235    Lipids:    Component Value Date/Time   CHOL 156 08/14/2012 0712   TRIG 148 08/14/2012 0712   HDL 58 08/14/2012 0712   VLDL 30 08/14/2012 0712   CHOLHDL 2.7 08/14/2012 0712  LDL calc = 68  Upreg negative  CXR: Emphysematous changes. No evidence of active pulmonary disease.  CT Head without Contrast: No acute intracranial abnormalities. Probable inflammatory changes  in the paranasal sinuses.  MRI Brain: Acute left hemisphere deep white matter infarction affects primarily the posterior limb internal capsule as well as portions of the surrounding basal ganglia and thalamus. No hemorrhage or mass effect.   MRA Head:  Negative MRA intracranial circulation.  Echo: normal, EF 55-60%  A1c 8.7%  Carotid dopplers: negative for stenosis   Levert Feinstein, MD 08/14/2012, 2:45 PM    UPPER LEVEL ADDENDUM  I have seen and examined Stephanie Fuller with Dr. Pollie Meyer and I agree with the above assessment/plan. I have reviewed all available data and have made any necessary changes to the above H&P.  Stephanie Fuller 08/14/2012, 4:47 PM

## 2012-08-14 NOTE — Progress Notes (Signed)
  Echocardiogram 2D Echocardiogram has been performed.  Stephanie Fuller 08/14/2012, 8:32 AM

## 2012-08-14 NOTE — ED Provider Notes (Signed)
Care assumed in the CDU after sign out from Dr. Lorenso Courier (pod A) this a.m. This is a 51 year old female otherwise healthy complaining of onset of weakness, garbled speech, left mouth droop, paresthesias to the left arm, and dizziness with difficulty ambulating ambulating onset yesterday August 18 at noon. Physical exam shows patient resting comfortably in her room with her husband at her side. Physical exam shows pupils equal round and reactive to light, her strength is 5 out of 5x4 extremities, finger to nose and heel-to-shin are normal with cranial nerves III through XII grossly intact, heart sounds are regular rate and rhythm with no murmurs rubs or gallops, lung sounds are clear to auscultation bilaterally abdomen is nontender. Patient speaks with a heavy accent and it is difficult to evaluate her speech in light of this however her husband states that her speech has improved significantly but is still not at its baseline.  Call from radiologist with acute finding of acute left hemispheric deep white matter infarction without hemorrhage or mass effect. Patient will be started on Lovenox, Case discussed with attending Dr. Deretha Emory and neurology consult  Will be ordered.    Wynetta Emery, PA-C 08/14/12 1026  Teaching service consult by Dr. Anselm Jungling appreciated she feels that admission is not warranted at this time his workup is essentially complete pending results of hemoglobin A1c and echocardiogram. Discussed case with neurologist Dr. Irving Burton who notes a subtle proximal right right-sided weakness.   Ordered 81 mg of chewable aspirin  Reevaluate orthostatic hypotension pending  Will advise smoking cessation  Consult from family practice Dr. Pollie Meyer appreciated she will evaluate the patient personally and admit her to her service.  Wynetta Emery, PA-C 08/14/12 1326

## 2012-08-14 NOTE — ED Notes (Signed)
Pt family states that she woke up at 08:00 was feeling fine. Pt did not eat, went to clean the bathroom for approximately 30-45 min in a closed area with bleach. Pt then ate and started having left arm pain, dizziness at 12:00 this afternoon. Pt also not walking right. Pt has equal grips, and strength bilaterally.

## 2012-08-14 NOTE — Progress Notes (Signed)
VASCULAR LAB PRELIMINARY  PRELIMINARY  PRELIMINARY  PRELIMINARY  Carotid duplex completed.    Preliminary report:  Bilateral:  No evidence of hemodynamically significant internal carotid artery stenosis.   Vertebral artery flow is antegrade.     Yael Coppess, RVS 08/14/2012, 8:14 AM

## 2012-08-14 NOTE — ED Notes (Signed)
Patient transported to Ultrasound 

## 2012-08-14 NOTE — ED Notes (Signed)
Pt was dizzy when she stood up

## 2012-08-14 NOTE — Progress Notes (Signed)
Initial review for inpatient status is complete. 

## 2012-08-15 LAB — CBC
Hemoglobin: 13.3 g/dL (ref 12.0–15.0)
MCHC: 34 g/dL (ref 30.0–36.0)
Platelets: 248 10*3/uL (ref 150–400)
RDW: 12.5 % (ref 11.5–15.5)

## 2012-08-15 LAB — LIPID PANEL
Cholesterol: 151 mg/dL (ref 0–200)
HDL: 51 mg/dL (ref >39)
Total CHOL/HDL Ratio: 3 RATIO
VLDL: 29 mg/dL (ref 0–40)

## 2012-08-15 LAB — HEMOGLOBIN A1C
Hgb A1c MFr Bld: 9.1 % — ABNORMAL HIGH
Mean Plasma Glucose: 214 mg/dL — ABNORMAL HIGH

## 2012-08-15 LAB — BASIC METABOLIC PANEL
BUN: 19 mg/dL (ref 6–23)
Calcium: 9.4 mg/dL (ref 8.4–10.5)
GFR calc Af Amer: >90 mL/min (ref >90)
GFR calc non Af Amer: >90 mL/min (ref >90)
Potassium: 4.3 mEq/L (ref 3.5–5.1)

## 2012-08-15 LAB — GLUCOSE, CAPILLARY: Glucose-Capillary: 118 mg/dL — ABNORMAL HIGH (ref 70–99)

## 2012-08-15 MED ORDER — ASPIRIN 81 MG PO CHEW: 81.0000 mg | CHEWABLE_TABLET | Freq: Every day | ORAL | Status: AC

## 2012-08-15 MED ORDER — METFORMIN HCL 500 MG PO TABS: 500.0000 mg | ORAL_TABLET | Freq: Two times a day (BID) | ORAL | Status: DC

## 2012-08-15 MED ORDER — PNEUMOCOCCAL VAC POLYVALENT 25 MCG/0.5ML IJ INJ: 0.5000 mL | INJECTION | INTRAMUSCULAR | Status: DC

## 2012-08-15 MED ORDER — LIVING WELL WITH DIABETES BOOK
Freq: Once | Status: AC
Start: 2012-08-15 — End: 2012-08-15
  Administered 2012-08-15: 12:00:00
  Filled 2012-08-15 (×2): qty 1

## 2012-08-15 MED ORDER — INSULIN ASPART 100 UNIT/ML ~~LOC~~ SOLN
2.0000 [IU] | Freq: Once | SUBCUTANEOUS | Status: AC
  Administered 2012-08-15: 2 [IU] via SUBCUTANEOUS

## 2012-08-15 NOTE — Progress Notes (Signed)
Seen and examined.  Feels great.  Speech improving.  OK to DC this afternoon.  Would DC on ASA, metformin and smoking cessation.  We will follow possibly symptomatic bradycardia as an outpatient.

## 2012-08-15 NOTE — Discharge Summary (Signed)
Family Medicine Teaching [facility] Discharge Summary  Patient name: Stephanie Fuller Medical record number: [id number] Date of birth: 1961-04-01 Age: 51 y.o. Gender: female Date of Admission: 08/14/2012  Date of Discharge: 08/15/2012 Admitting Physician: Sanjuana Letters, MD  Primary Care Provider:  [facility] Medicine at [facility]  Indication for Hospitalization: acute ischemic CVA Discharge Diagnoses:  1. Acute ischemic CVA (primary diagnosis) 2. Diabetes, newly diagnosed 3. Hypotension 4. Bradycardia 5. Tobacco abuse  Consultations: Neurology  Significant Labs and Imaging:  CXR: Emphysematous changes. No evidence of active pulmonary disease.   CT Head without Contrast: No acute intracranial abnormalities. Probable inflammatory changes  in the paranasal sinuses.   MRI Brain:  Acute left hemisphere deep white matter infarction affects primarily the posterior limb internal capsule as well as portions of the surrounding basal ganglia and thalamus. No hemorrhage or mass effect.   MRA Head:  Negative MRA intracranial circulation.   Echo: normal, EF 55-60%   A1c 8.7%   TSH 0.697  Carotid dopplers: negative for stenosis  Lipids:    Component Value Date/Time   CHOL 151 08/15/2012 0645   TRIG 145 08/15/2012 0645   HDL 51 08/15/2012 0645   VLDL 29 08/15/2012 0645   CHOLHDL 3.0 08/15/2012 0645        LDL  71      08/15/2012 0645  CBC:    Component Value Date/Time   WBC 7.8 08/15/2012 0645   HGB 13.3 08/15/2012 0645   HCT 39.1 08/15/2012 0645   PLT 248 08/15/2012 0645   MCV 87.9 08/15/2012 0645   Comprehensive Metabolic Panel:    Component Value Date/Time   NA 139 08/15/2012 0645   K 4.3 08/15/2012 0645   CL 105 08/15/2012 0645   CO2 26 08/15/2012 0645   BUN 19 08/15/2012 0645   CREATININE 0.60 08/15/2012 0645   GLUCOSE 193* 08/15/2012 0645   CALCIUM 9.4 08/15/2012 0645   AST 13 08/14/2012 0235   ALT 16 08/14/2012 0235   ALKPHOS 69 08/14/2012 0235   BILITOT 0.1*  08/14/2012 0235   PROT 6.5 08/14/2012 0235   ALBUMIN 3.6 08/14/2012 0235   Procedures: none  Brief Hospital Course: Patient presented to the hospital complaining of new onset slurred speech and was found to have an acute cerebral infarct on brain MRI (see results above). She received a complete stroke workup, including an echocardiogram, carotid dopplers, TSH, and Hemoglobin A1c. Of these tests, the only significant result was a Hemoglobin A1c of 8.7%. Patient has never in the past been told she has diabetes. We started her on Metformin 500mg  BID and gave her sliding scale insulin while she was here in the hospital. This newly diagnosed diabetes and the patient's history of smoking are her only CVA risk factors as far as we can tell. While she was here in the hospital, she was seen by physical therapy, who did not recommend further needs. She will need close follow up with her primary doctor for management of diabetes, smoking cessation, and possible referral to speech therapy.  Througout patient's stay here, she was hypotensive to the 80s/50s and bradycardic down as low as 40s. She remained asymptomatic (although was orthostatic when standing on occasion), so we did not pursue a workup for this as an inpatient. It is possible that her hypotension and bradycardia are a result of her acute CVA. We would recommend that her primary doctor consider outpatient cardiology referral if her hypotension and bradycardia were to continue.  Discharge Medications:  Medication List  As of 08/16/2012 11:21 AM   TAKE these medications         aspirin 81 MG chewable tablet   Chew 1 tablet (81 mg total) by mouth daily.      CALCIUM CARBONATE PO   Take 1 tablet by mouth daily.      metFORMIN 500 MG tablet   Commonly known as: GLUCOPHAGE   Take 1 tablet (500 mg total) by mouth 2 (two) times daily with a meal.      MULTI-VITAMIN GUMMIES Chew   Chew 1 tablet by mouth daily.      naproxen sodium 220 MG tablet    Commonly known as: ANAPROX   Take 220 mg by mouth daily as needed. For pain            Follow-up Information    Schedule an appointment as soon as possible for a visit with [facility]. (within one week)    Contact information:   [address] [place] [address] [phone]        Issues for Follow Up:  -Will need to address outpatient diabetes medication. We have started her on Metformin 500mg  BID -Consider outpatient cardiology follow up for hypotension and bradycardia -Pt expressed strong desire to quit smoking while in hospital; would recommend follow up smoking cessation cousneling -If speech deficits persist, may need to see speech language pathologist. We were not able to get this done in the hospital and did not want to hold up her discharge for another day to wait for SLP.  Outstanding Results: none  Discharge Instructions: Please refer to Patient Instructions section of EMR for full details.  Patient was counseled important signs and symptoms that should prompt return to medical care, changes in medications, dietary instructions, activity restrictions, and follow up appointments.  Significant instructions noted below:  Discharge Condition: stable  Levert Feinstein, MD 08/15/2012, 1:22 PM

## 2012-08-15 NOTE — Evaluation (Signed)
Physical Therapy Evaluation Patient Details Name: Stephanie Fuller MRN: [id number] DOB: [birth year] Today's Date: 08/15/2012 Time: 1478-2956 PT Time Calculation (min): 32 min  PT Assessment / Plan / Recommendation Clinical Impression  Pt. was admitted due to slurred speech and found to have an acute Left hemishpere CVA, low BP and low HR, new diagnosis of diabetes.  She did not show any residual deficits of gross mobility or strenght of LEs'and appears at her baseline level of function.  Her family is present and very supportive.  Do not believe she needs acute PT or any PT follow-up after discharge.  Believe she can safely walk in halls with nursing staff and family as long as she is asymptomatic with BP.  discussed at length pt's risk factiors with her and family.  She is a heavy smoker but is interested in smoking cessation.  Recommend smoking cessation consult.  Will sign off as no PT goals set and no acute f/u indicated.      PT Assessment  Patent does not need any further PT services    Follow Up Recommendations  No PT follow up    Barriers to Discharge        Equipment Recommendations  None recommended by PT    Recommendations for Other Services Other (comment) (SMOKING CESSATION CONSULT)   Frequency      Precautions / Restrictions Precautions Precautions: Other (comment) (running low BPs and HR) Restrictions Weight Bearing Restrictions: No   Pertinent Vitals/Pain No pain; resting HR 56, 80 with activity and asymptomatic      Mobility  Bed Mobility Bed Mobility: Supine to Sit;Sit to Supine Supine to Sit: 6: Modified independent (Device/Increase time);HOB flat Sit to Supine: Not Tested (comment) Details for Bed Mobility Assistance: pt,. managing at mod I level with use of rail Transfers Transfers: Sit to Stand;Stand to Sit Sit to Stand: 6: Modified independent (Device/Increase time);From bed;Without upper extremity assist Stand to Sit: 6: Modified independent  (Device/Increase time);To chair/3-in-1;With upper extremity assist Details for Transfer Assistance: cues for hand placement  Ambulation/Gait Ambulation/Gait Assistance: 6: Modified independent (Device/Increase time) Ambulation Distance (Feet): 200 Feet Assistive device: None Ambulation/Gait Assistance Details: Pt. able to ambulate around unit approximately 200 feet without overt LOB.  Denies dizziness or lightheadedness.  Pt. feels her gait is at her baseline.  Family agrees Gait Pattern: Within Functional Limits Gait velocity: normal Stairs: No Wheelchair Mobility Wheelchair Mobility: No Modified Rankin (Stroke Patients Only) Pre-Morbid Rankin Score: No symptoms Modified Rankin: No symptoms    Exercises     PT Diagnosis:    PT Problem List:   PT Treatment Interventions:     PT Goals    Visit Information  Last PT Received On: 08/15/12 Assistance Needed: +1    Subjective Data  Subjective: I've walked in the bathroom Patient Stated Goal: to be healthy for the 2 kids.  Return to work.   Prior Functioning  Home Living Lives With: Spouse;Son;Daughter Available Help at Discharge: Family;Available 24 hours/day;Other (Comment) (initially) Type of Home: House Home Access: Stairs to enter [employer] of Steps: 1 Home Layout: One level Bathroom Shower/Tub: Forensic scientist: Standard Home Adaptive Equipment: None Prior Function Level of Independence: Independent Able to Take Stairs?: Yes Driving: Yes Vocation: Other (comment) (temporary full time) Communication Communication: Prefers language other than English    Cognition  Overall Cognitive Status: Appears within functional limits for tasks assessed/performed Arousal/Alertness: Awake/alert Orientation Level: Oriented X4 / Intact Behavior During Session: [facility] for tasks performed  Extremity/Trunk Assessment Right Upper Extremity Assessment RUE ROM/Strength/Tone: WFL for tasks  assessed RUE Sensation: WFL - Light Touch Left Upper Extremity Assessment LUE ROM/Strength/Tone: WFL for tasks assessed LUE Sensation: WFL - Light Touch Right Lower Extremity Assessment RLE ROM/Strength/Tone: Within functional levels RLE Sensation: WFL - Light Touch RLE Coordination: WFL - gross/fine motor Left Lower Extremity Assessment LLE ROM/Strength/Tone: Within functional levels LLE Sensation: WFL - Light Touch LLE Coordination: WFL - gross/fine motor Trunk Assessment Trunk Assessment: Normal   Balance Balance Balance Assessed: Yes Static Sitting Balance Static Sitting - Balance Support: No upper extremity supported;Feet supported Static Sitting - Level of Assistance: 7: Independent Dynamic Sitting Balance Dynamic Sitting - Balance Support: No upper extremity supported;Feet supported;During functional activity Dynamic Sitting - Level of Assistance: 7: Independent Reach (Patient is able to reach ___ inches to right, left, forward, back): 12 Static Standing Balance Static Standing - Balance Support: No upper extremity supported;During functional activity Single Leg Stance - Right Leg: 10  Single Leg Stance - Left Leg: 10   End of Session PT - End of Session Equipment Utilized During Treatment: Gait belt Activity Tolerance: Patient tolerated treatment well Patient left: in chair;with family/visitor present Nurse Communication: Mobility status  GP     Stephanie Fuller 08/15/2012, 10:43 AM Stephanie Fuller PT Acute Rehab Services [phone] [place] [phone]

## 2012-08-15 NOTE — Progress Notes (Signed)
Blood sugar was 284. No insulin coverage. Notified the doctor. Received an order for insulin and recheck CBG at 0200. Will continue to monitor.

## 2012-08-15 NOTE — Progress Notes (Signed)
Blood pressure at 0230 was 80/58 (manual), pt is asymptomatic. Notified on call doctor. No new interventions at this time. Will monitor the patient closely.

## 2012-08-15 NOTE — Progress Notes (Addendum)
Inpatient Diabetes Program Recommendations  AACE/ADA: New Consensus Statement on Inpatient Glycemic Control (2013)  Target Ranges:  Prepandial:   less than 140 mg/dL      Peak postprandial:   less than 180 mg/dL (1-2 hours)      Critically ill patients:  140 - 180 mg/dL   Reason for Visit: Results for HIROMI, KNODEL (MRN [id number]) as of 08/15/2012 15:47  Ref. Range 08/14/2012 21:53 08/15/2012 02:26 08/15/2012 07:05 08/15/2012 11:38  Glucose-Capillary Latest Range: 70-99 mg/dL 409 (H) 811 (H) 914 (H) 118 (H)  New Onset diabetes.  A1C=9.1%.  Discussed briefly with patient.  RN has given patient "Living Well with Diabetes" booklet and has helped patient watch diabetes videos.  Patient states that "her children can read the information to her", indicating that she may not be able to read english.  Gave her Diabetes A to Z- DVD to take home and watch also.  Briefly explained physiology of diabetes, foot care, and normal CBG ranges.  Left Rx. Form for meter with RN if resident plans to order CBG meter for home.  Note patient to be discharged home on Metformin.  Per hospital protocol ordered outpatient diabetes education-patient will be called after discharge to set-up appointment.  Patient states she has no questions at this time.  Seems slightly overwhelmed with information.  Stressed importance of follow-up with PCP.

## 2012-08-15 NOTE — Progress Notes (Signed)
Stroke Team Progress Note  HISTORY  51 y.o. RH Female originally from [place], on Sunday Noticed she noted weakness on the right UE. That night her husband noted dysarthria and brought patient to ED. Since admission patients BP has been systolic in 130's, and HR in 48-54 and asymptomatic. 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  Patient denied any chest pain, no sob, no sensory deficits, no dysphagia, no diplopia  Presently she is complaining of right arm and leg weakness but no sensory changes. Husband states her speech has improved but is not back to baseline. She does smoke 1.5 PPD.   SUBJECTIVE Her family is at the bedside.  Overall she feels her condition is gradually improving but does remain weak. Denies being on aspirin or any type of anticoagulant at home.  OBJECTIVE Most recent Vital Signs: Filed Vitals:   08/15/12 0420 08/15/12 0435 08/15/12 0627 08/15/12 1030  BP: 88/55 90/60 87/53  105/69  Pulse: 42  48 51  Temp: 98.1 F (36.7 C)  97.8 F (36.6 C) 97.7 F (36.5 C)  TempSrc: Oral  Oral   Resp: 17  17 16   Weight:   66.4 kg (146 lb 6.2 oz)   SpO2: 92%  94% 100%   CBG (last 3)   Basename 08/15/12 1138 08/15/12 0705 08/15/12 0226  GLUCAP 118* 187* 151*   Intake/Output from previous day:    IV Fluid Intake:     MEDICATIONS    . aspirin  81 mg Oral Once  . aspirin  81 mg Oral Daily  . enoxaparin  40 mg Subcutaneous Q24H  . insulin aspart  0-9 Units Subcutaneous TID WC  . insulin aspart  2 Units Subcutaneous Once  . living well with diabetes book   Does not apply Once  . metFORMIN  500 mg Oral BID WC  . sodium chloride  3 mL Intravenous Q12H  . DISCONTD: aspirin  324 mg Oral Once  . DISCONTD: aspirin EC  81 mg Oral Daily  . DISCONTD: enoxaparin (LOVENOX) injection  40 mg Subcutaneous Q24H   PRN:  sodium chloride,  acetaminophen, ondansetron (ZOFRAN) IV, senna-docusate, sodium chloride  Diet:  Cardiac  Activity:  Up with assistance DVT Prophylaxis: lovenox  CLINICALLY SIGNIFICANT STUDIES Basic Metabolic Panel:  Lab 08/15/12 0454 08/14/12 0712 08/14/12 0235  NA 139 -- 141  K 4.3 -- 3.6  CL 105 -- 106  CO2 26 -- 25  GLUCOSE 193* -- 287*  BUN 19 -- 16  CREATININE 0.60 0.61 --  CALCIUM 9.4 -- 9.8  MG -- -- --  PHOS -- -- --   Liver Function Tests:  Lab 08/14/12 0235  AST 13  ALT 16  ALKPHOS 69  BILITOT 0.1*  PROT 6.5  ALBUMIN 3.6   CBC:  Lab 08/15/12 0645 08/14/12 1714  WBC 7.8 9.7  NEUTROABS -- --  HGB 13.3 13.7  HCT 39.1 41.0  MCV 87.9 87.4  PLT 248 261   Coagulation:  Lab 08/14/12 0235  LABPROT 12.4  INR 0.91   Cardiac Enzymes:  Lab 08/14/12 0235  CKTOTAL --  CKMB --  CKMBINDEX --  TROPONINI <0.30   Urinalysis: No results found for this basename: COLORURINE:2,APPERANCEUR:2,LABSPEC:2,PHURINE:2,GLUCOSEU:2,HGBUR:2,BILIRUBINUR:2,KETONESUR:2,PROTEINUR:2,UROBILINOGEN:2,NITRITE:2,LEUKOCYTESUR:2 in the last 168 hours Lipid Panel    Component Value Date/Time   CHOL 151 08/15/2012 0645   TRIG 145 08/15/2012 0645   HDL 51 08/15/2012 0645  CHOLHDL 3.0 08/15/2012 0645   VLDL 29 08/15/2012 0645   LDLCALC 71 08/15/2012 0645   HgbA1C  Lab Results  Component Value Date   HGBA1C 9.1* 08/14/2012     Dg Chest 2 View 08/14/2012  Emphysematous changes.  No evidence of active pulmonary disease.    Ct Head Wo Contrast 08/14/2012 Mild cerebral atrophy. No acute intracranial abnormalities.  Probable inflammatory changes in the paranasal sinuses.     Mr Hartley Barefoot Contrast 08/14/2012  6 x 17 mm area of acute infarction affects the posterior limb internal capsule and basal ganglia on the left, as well as a portion of the dorsal lateral thalamus.  No associated hemorrhage or mass effect.  No mass lesion is seen.  There is no atrophy or significant white matter disease.  There is no  midline shift.  The major intracranial vascular structures are patent.  There is chronic mucosal thickening throughout the paranasal sinuses with an air-fluid level in the left sphenoid division.  Calvarium and skull base intact.  Negative upper cervical region.  Pituitary and cerebellar tonsils unremarkable. Grossly negative orbits.  Compared with prior CT, the infarct is not visible.  IMPRESSION: Acute left hemisphere deep white matter infarction affects primarily the posterior limb internal capsule as well as portions of the surrounding basal ganglia and thalamus.  No hemorrhage or mass effect.  MRA HEAD  Findings: Widely patent internal carotid arteries.  Widely patent basilar artery.  Right greater than left vertebral arteries contribute formation of the basilar.  There is no proximal intracranial stenosis or aneurysm.  IMPRESSION: Negative MRA intracranial circulation.   Original Report Authenticated By: Elsie Stain, M.D. ( 08/14/2012 10:04:25 )    2D Echocardiogram  Left ventricle: The cavity size was normal. Systolic function was normal. The estimated ejection fraction was in the range of 55% to 60%. Wall motion was normal; there were no regional wall motion abnormalities.  Carotid Doppler   - Right - Minimal intimal wall changes od the CCA. Mild homogeneous plaque on the far wall of he bulb. Mild heterogeneous plaque on the far wall of the distal ECA  - Left - mild focal smooth homogeneous plaque on the far wall of the bifurcation into the bulb. Mild heterogeneous plaque on the far wall on the proximal ICA - No significant extracranial carotid artery stenosis demonstrated. Vertebrals are patent with antegrade flow.  EKG  sinus bradycardia.   Therapy Recommendations PT - patient seen and felt not to need any further PT svcs; OT - ; ST -   Physical Exam  [place] European middle-aged lady currently not in distress.Awake alert. Afebrile. Head is nontraumatic. Neck is supple without  bruit. Hearing is normal. Cardiac exam no murmur or gallop. Lungs are clear to auscultation. Distal pulses are well felt.  Neurological Exam : Awake alert oriented x 3 normal speech and language.fundi not visualized. Visual fields and acuity are normal Mild right lower face asymmetry. Tongue midline. No drift. Mild diminished fine finger movements on right. Orbits left over right upper extremity. Mild right grip weak.. Normal sensation . Normal coordination.  ASSESSMENT Ms. Donna Maurer is a 51 y.o. female presenting with right UE/LE weakness, aphasia. No tPA due to out of window. 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 . Infarct felt to be multifactorial due to risk factors, ongoing workup.  On no prior to  admission. Now on aspirin 81 mg orally every day for secondary stroke prevention. Patient with resultant weakness.  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 . -diabetes -tobacco abuse  Hospital day # 1  TREATMENT/PLAN -Continue aspirin 81 mg orally every day for secondary stroke prevention. -Risk factor modification (LDL 71), Hgb A1C 9.1 -smoking cessation counselling and -treatment for new diagnosis of diabetes and strict outpatient followup with primary care physician for risk factor modification. -long discussion with patient and son and answered questions  Stroke Service will sign off. Follow up with Dr. Pearlean Brownie in 2 months.  Guy Franco, [facility],  MBA, MHA [facility] Pager: [phone] 08/15/2012 1:06 PM  Scribe for Dr. Delia Heady, Stroke Center Medical Director. He has personally reviewed chart, pertinent data, examined the patient and developed the plan of care. Pager:  [phone]

## 2012-08-15 NOTE — Progress Notes (Signed)
Patient d/c home ambulatory accompanied by Significant other. D/c and medication instructions given to patient and husband. Patient educated on stroke and diabetes, answered all questions. Assessments remained unchanged prior to d/c

## 2012-08-15 NOTE — Progress Notes (Signed)
Family Medicine Teaching Service Daily Progress Note Service Page: [phone]  Patient Assessment: Stephanie Fuller is a 51 y.o. year old female presenting with new onset slurred speech, found to have an acute cerebral infarct on brain MRI.   Subjective: feeling well today, denies headache. Thinks her speech is improving. Per husband and teenage children, speech is closer to baseline but not entirely back to normal.  Objective: Temp:  [97.6 F (36.4 C)-98.6 F (37 C)] 97.8 F (36.6 C) (08/20 0627) Pulse Rate:  [42-79] 48  (08/20 0627) Resp:  [14-18] 17  (08/20 0627) BP: (80-129)/(51-92) 87/53 mmHg (08/20 0627) SpO2:  [92 %-100 %] 94 % (08/20 0627) Weight:  [146 lb 6.2 oz (66.4 kg)] 146 lb 6.2 oz (66.4 kg) (08/20 4540) Exam: General: NAD, lying in bed Cardiovascular: regular rhythm, slightly bradycardic, no murmurs auscultated Respiratory: normal respiratory effort Abdomen: nontender Extremities: LE nontender to palpation Neuro: speech more in tact than yesterday but still has some trouble with word finding, grip 5/5 bilaterally, smile symmetric  I have reviewed the patient's medications, labs, imaging, and diagnostic testing.  Notable results are summarized below.  CBC BMET   Lab 08/15/12 0645 08/14/12 1714 08/14/12 0712  WBC 7.8 9.7 8.6  HGB 13.3 13.7 13.1  HCT 39.1 41.0 38.7  PLT 248 261 249    Lab 08/15/12 0645 08/14/12 0712 08/14/12 0235  NA 139 -- 141  K 4.3 -- 3.6  CL 105 -- 106  CO2 26 -- 25  BUN 19 -- 16  CREATININE 0.60 0.61 0.57  GLUCOSE 193* -- 287*  CALCIUM 9.4 -- 9.8     Imaging/Diagnostic Tests: Upreg negative   CXR: Emphysematous changes. No evidence of active pulmonary disease.   CT Head without Contrast: No acute intracranial abnormalities. Probable inflammatory changes  in the paranasal sinuses.   MRI Brain:  Acute left hemisphere deep white matter infarction affects primarily the posterior limb internal capsule as well as portions of the  surrounding basal ganglia and thalamus. No hemorrhage or mass effect.   MRA Head:  Negative MRA intracranial circulation.   Echo: normal, EF 55-60%   A1c 8.7%  Lipids normal, LDL calc=68  Carotid dopplers: negative for stenosis  Plan: [name] is a 51 y.o. year old female presenting with new onset slurred speech, found to have an acute cerebral infarct on brain MRI.  1. CVA - Neuro was consulted in ED and has made recommendations. Allowing permissive HTN; do not treat unless > 210 systolic. Will initiate aspirin 81mg  daily. Have checked lipids, a1c, TSH, echo, dopplers, all of which are negative except A1c of 8.7. Needs OT and speech therapy evaluations. Continue aspirin 81mg . 2. Diabetes - new diagnosis; Hgb A1c 8.7. Will start metformin now as renal function normal and patient did not get any CT contrast. SSI while in house as well. CBG's relatively well controlled right now. 3. Hypotension/bradycardia: consider cards consult as an outpatient. 4. Tobacco abuse - smoking cessation counseling 5. FEN/GI: KVO IV, pending swallow eval will start carb modified diet 6. Prophylaxis: Lovenox 7. Disposition: pending OT/SLP evals; PT recs no follow up; can likely go home today after evaluation completed. 8. Code Status: full code  Levert Feinstein, MD 08/15/2012, 8:41 AM

## 2012-08-15 NOTE — Progress Notes (Signed)
Occupational Therapy Evaluation Patient Details Name: Stephanie Fuller MRN: [id number] DOB: 1961-12-23 Today's Date: 08/15/2012 Time: 1557-1610 OT Time Calculation (min): 13 min  OT Assessment / Plan / Recommendation Clinical Impression  51 yo s/p small acute L deep CVA who presented with R U LE weakness and slurred speech. All symptoms have apparently resolved. Pt states she feels back to normal. Pt does not demonstrate any neurological deficits at this time. Discussed CVA risk factors with pt. Pt stated she wanted to stop smoking. No OT needs at this time. OT signing off.    OT Assessment  Patient does not need any further OT services    Follow Up Recommendations  No OT follow up    Barriers to Discharge      Equipment Recommendations  None recommended by OT    Recommendations for Other Services  none  Frequency     eval only  Precautions / Restrictions     Pertinent Vitals/Pain none    ADL  Eating/Feeding: Simulated;Independent Grooming: Performed;Independent Where Assessed - Grooming: Unsupported standing Upper Body Bathing: Simulated;Independent Where Assessed - Upper Body Bathing: Unsupported sit to stand Lower Body Bathing: Simulated;Independent Where Assessed - Lower Body Bathing: Unsupported sit to stand Upper Body Dressing: Simulated;Independent Where Assessed - Upper Body Dressing: Unsupported sitting Lower Body Dressing: Simulated;Independent Where Assessed - Lower Body Dressing: Unsupported sitting Toilet Transfer: Simulated;Independent Toilet Transfer Method: Sit to Barista: Comfort height toilet Toileting - Clothing Manipulation and Hygiene: Simulated;Independent Where Assessed - Toileting Clothing Manipulation and Hygiene: Standing Transfers/Ambulation Related to ADLs: independent ADL Comments: Independent with all ADL    OT Diagnosis:    OT Problem List:   OT Treatment Interventions:     OT Goals Acute Rehab OT Goals OT  Goal Formulation:  (eval only)  Visit Information  Last OT Received On: 08/15/12 Assistance Needed: +1    Subjective Data      Prior Functioning  Vision/Perception  Home Living Lives With: Spouse;Son;Daughter Available Help at Discharge: Family;Available 24 hours/day;Other (Comment) (initially) Type of Home: House Home Access: Stairs to enter [employer] of Steps: 1 Home Layout: One level Bathroom Shower/Tub: Forensic scientist: Standard Bathroom Accessibility: Yes How Accessible: Accessible via walker Home Adaptive Equipment: None Prior Function Level of Independence: Independent Able to Take Stairs?: Yes Driving: Yes Vocation: Other (comment) (temporary full time) Communication Communication: Prefers language other than English Dominant Hand: Right   Perception Perception: Within Functional Limits Praxis Praxis: Intact  Cognition  Overall Cognitive Status: Appears within functional limits for tasks assessed/performed Arousal/Alertness: Awake/alert Orientation Level: Oriented X4 / Intact Behavior During Session: [facility] for tasks performed    Extremity/Trunk Assessment Right Upper Extremity Assessment RUE ROM/Strength/Tone: Within functional levels RUE Sensation: WFL - Light Touch;WFL - Proprioception RUE Coordination: WFL - gross/fine motor Left Upper Extremity Assessment LUE ROM/Strength/Tone: Within functional levels LUE Sensation: WFL - Light Touch;WFL - Proprioception LUE Coordination: WFL - gross/fine motor   Mobility Bed Mobility Bed Mobility: Supine to Sit Supine to Sit: 7: Independent Sit to Supine: 7: Independent Transfers Transfers: Sit to Stand;Stand to Sit Sit to Stand: 7: Independent Stand to Sit: 7: Independent   Exercise    Balance  WFL  End of Session OT - End of Session Activity Tolerance: Patient tolerated treatment well Patient left: in bed;with call bell/phone within reach  GO      [facility] 08/15/2012, 4:15 PM [facility], OTR/L  [phone] 08/15/2012

## 2012-08-16 NOTE — Discharge Summary (Signed)
Seen and examined on the day of DC.  Agree with Dr. McIntyre's documentation and management. 

## 2012-08-16 NOTE — ED Provider Notes (Signed)
Medical screening examination/treatment/procedure(s) were conducted as a shared visit with non-physician practitioner(s) and myself.  I personally evaluated the patient during the encounter  Patient was turnover to me going to CDU for TIA Eval but radiology exam depicted acute stroke and admission required. Neurology agreed with admission and will consult but we ran into difficulty in getting teaching service to admit. This was eventually resolved. Patient not candidate for TPA due to duration of symptoms, but definitely warranted admission.   Shelda Jakes, MD 08/16/12 2034

## 2012-08-16 NOTE — Care Management Note (Signed)
    Page 1 of 1   08/16/2012     12:19:22 PM   CARE MANAGEMENT NOTE 08/16/2012  Patient:  Stephanie Fuller,Stephanie Fuller   Account Number:  [id number]  Date Initiated:  08/16/2012  Documentation initiated by:  Onnie Boer  Subjective/Objective Assessment:   PT WAS ADMITTED WITH WEAKNESS     Action/Plan:   PROGRESSION OF CARE AND DISCHARGE PLANNING   Anticipated DC Date:  08/15/2012   Anticipated DC Plan:  HOME/SELF CARE         Choice offered to / List presented to:             Status of service:  Completed, signed off Medicare Important Message given?   (If response is "NO", the following Medicare IM given date fields will be blank) Date Medicare IM given:   Date Additional Medicare IM given:    Discharge Disposition:  HOME/SELF CARE  Per UR Regulation:  Reviewed for med. necessity/level of care/duration of stay  If discussed at Long Length of Stay Meetings, dates discussed:    Comments:  08/16/12 Onnie Boer, RN, BSN 1218 PT WAS DC'D TO HOME WITH SELF CARE

## 2012-09-19 ENCOUNTER — Other Ambulatory Visit: Payer: Self-pay | Admitting: Family Medicine

## 2012-09-20 ENCOUNTER — Other Ambulatory Visit: Payer: Self-pay | Admitting: Family Medicine

## 2012-09-25 ENCOUNTER — Ambulatory Visit: Payer: BC Managed Care – PPO | Admitting: *Deleted

## 2013-05-01 ENCOUNTER — Ambulatory Visit
Admission: RE | Admit: 2013-05-01 | Discharge: 2013-05-01 | Disposition: A | Discharge status: Home / Self Care | Payer: PRIVATE HEALTH INSURANCE | Source: Ambulatory Visit | Attending: Family Medicine | Admitting: Family Medicine

## 2013-05-01 ENCOUNTER — Other Ambulatory Visit: Payer: Self-pay | Admitting: Family Medicine

## 2013-05-01 DIAGNOSIS — M79642 Pain in left hand: Secondary | ICD-10-CM

## 2013-05-01 DIAGNOSIS — K5900 Constipation, unspecified: Secondary | ICD-10-CM

## 2014-01-26 ENCOUNTER — Encounter: Payer: Self-pay | Admitting: *Deleted

## 2014-01-26 DIAGNOSIS — I249 Acute ischemic heart disease, unspecified: Secondary | ICD-10-CM | POA: Insufficient documentation

## 2014-01-26 DIAGNOSIS — E119 Type 2 diabetes mellitus without complications: Secondary | ICD-10-CM | POA: Insufficient documentation

## 2014-05-31 ENCOUNTER — Ambulatory Visit
Admission: RE | Admit: 2014-05-31 | Discharge: 2014-05-31 | Disposition: A | Discharge status: Home / Self Care | Payer: PRIVATE HEALTH INSURANCE | Source: Ambulatory Visit | Attending: Family Medicine | Admitting: Family Medicine

## 2014-05-31 ENCOUNTER — Other Ambulatory Visit: Payer: Self-pay | Admitting: Family Medicine

## 2014-05-31 DIAGNOSIS — M79642 Pain in left hand: Secondary | ICD-10-CM

## 2014-06-25 ENCOUNTER — Other Ambulatory Visit (HOSPITAL_COMMUNITY)
Admission: RE | Admit: 2014-06-25 | Discharge: 2014-06-25 | Disposition: A | Discharge status: Home / Self Care | Payer: PRIVATE HEALTH INSURANCE | Source: Ambulatory Visit | Attending: Family Medicine | Admitting: Family Medicine

## 2014-06-25 ENCOUNTER — Other Ambulatory Visit: Payer: Self-pay | Admitting: Family Medicine

## 2014-06-25 DIAGNOSIS — Z1151 Encounter for screening for human papillomavirus (HPV): Secondary | ICD-10-CM | POA: Insufficient documentation

## 2014-06-25 DIAGNOSIS — Z124 Encounter for screening for malignant neoplasm of cervix: Secondary | ICD-10-CM | POA: Insufficient documentation

## 2014-06-25 DIAGNOSIS — R87810 Cervical high risk human papillomavirus (HPV) DNA test positive: Secondary | ICD-10-CM | POA: Insufficient documentation

## 2014-06-26 LAB — CYTOLOGY - PAP

## 2015-04-07 ENCOUNTER — Other Ambulatory Visit: Payer: Self-pay | Admitting: Family Medicine

## 2015-04-07 DIAGNOSIS — Z1231 Encounter for screening mammogram for malignant neoplasm of breast: Secondary | ICD-10-CM

## 2015-04-09 ENCOUNTER — Ambulatory Visit
Admission: RE | Admit: 2015-04-09 | Discharge: 2015-04-09 | Disposition: A | Discharge status: Home / Self Care | Payer: PRIVATE HEALTH INSURANCE | Source: Ambulatory Visit | Attending: Family Medicine | Admitting: Family Medicine

## 2015-04-09 DIAGNOSIS — Z1231 Encounter for screening mammogram for malignant neoplasm of breast: Secondary | ICD-10-CM

## 2015-04-17 ENCOUNTER — Other Ambulatory Visit: Payer: Self-pay | Admitting: Family Medicine

## 2015-04-17 DIAGNOSIS — R928 Other abnormal and inconclusive findings on diagnostic imaging of breast: Secondary | ICD-10-CM

## 2015-04-22 ENCOUNTER — Ambulatory Visit
Admission: RE | Admit: 2015-04-22 | Discharge: 2015-04-22 | Disposition: A | Discharge status: Home / Self Care | Payer: PRIVATE HEALTH INSURANCE | Source: Ambulatory Visit | Attending: Family Medicine | Admitting: Family Medicine

## 2015-04-22 DIAGNOSIS — R928 Other abnormal and inconclusive findings on diagnostic imaging of breast: Secondary | ICD-10-CM

## 2015-07-17 ENCOUNTER — Other Ambulatory Visit: Payer: Self-pay | Admitting: Family Medicine

## 2015-07-17 ENCOUNTER — Other Ambulatory Visit (HOSPITAL_COMMUNITY)
Admission: RE | Admit: 2015-07-17 | Discharge: 2015-07-17 | Disposition: A | Discharge status: Home / Self Care | Payer: PRIVATE HEALTH INSURANCE | Source: Ambulatory Visit | Attending: Family Medicine | Admitting: Family Medicine

## 2015-07-17 DIAGNOSIS — Z124 Encounter for screening for malignant neoplasm of cervix: Secondary | ICD-10-CM | POA: Diagnosis present

## 2015-07-17 DIAGNOSIS — Z1151 Encounter for screening for human papillomavirus (HPV): Secondary | ICD-10-CM | POA: Diagnosis present

## 2015-07-21 LAB — CYTOLOGY - PAP

## 2016-10-13 ENCOUNTER — Other Ambulatory Visit: Payer: Self-pay | Admitting: Family Medicine

## 2016-10-13 DIAGNOSIS — Z1231 Encounter for screening mammogram for malignant neoplasm of breast: Secondary | ICD-10-CM

## 2016-10-18 ENCOUNTER — Ambulatory Visit
Admission: RE | Admit: 2016-10-18 | Discharge: 2016-10-18 | Disposition: A | Discharge status: Home / Self Care | Payer: PRIVATE HEALTH INSURANCE | Source: Ambulatory Visit | Attending: Family Medicine | Admitting: Family Medicine

## 2016-10-18 DIAGNOSIS — Z1231 Encounter for screening mammogram for malignant neoplasm of breast: Secondary | ICD-10-CM | POA: Insufficient documentation

## 2017-08-05 ENCOUNTER — Other Ambulatory Visit: Payer: Self-pay | Admitting: Family Medicine

## 2017-08-08 ENCOUNTER — Other Ambulatory Visit: Payer: Self-pay | Admitting: Family Medicine

## 2017-08-08 DIAGNOSIS — R1030 Lower abdominal pain, unspecified: Secondary | ICD-10-CM

## 2017-09-05 ENCOUNTER — Ambulatory Visit
Admission: RE | Admit: 2017-09-05 | Discharge: 2017-09-05 | Disposition: A | Discharge status: Home / Self Care | Payer: PRIVATE HEALTH INSURANCE | Source: Ambulatory Visit | Attending: Family Medicine | Admitting: Family Medicine

## 2017-09-05 DIAGNOSIS — R1030 Lower abdominal pain, unspecified: Secondary | ICD-10-CM

## 2018-01-24 ENCOUNTER — Other Ambulatory Visit (HOSPITAL_COMMUNITY)
Admission: RE | Admit: 2018-01-24 | Discharge: 2018-01-24 | Disposition: A | Discharge status: Home / Self Care | Payer: PRIVATE HEALTH INSURANCE | Source: Ambulatory Visit | Attending: Family Medicine | Admitting: Family Medicine

## 2018-01-24 ENCOUNTER — Other Ambulatory Visit: Payer: Self-pay | Admitting: Family Medicine

## 2018-01-24 DIAGNOSIS — Z01411 Encounter for gynecological examination (general) (routine) with abnormal findings: Secondary | ICD-10-CM | POA: Diagnosis not present

## 2018-01-30 LAB — CYTOLOGY - PAP
DIAGNOSIS: NEGATIVE
DIAGNOSIS: REACTIVE
HPV: NOT DETECTED

## 2018-07-31 ENCOUNTER — Other Ambulatory Visit: Payer: Self-pay

## 2018-07-31 DIAGNOSIS — I83891 Varicose veins of right lower extremities with other complications: Secondary | ICD-10-CM

## 2018-11-06 ENCOUNTER — Ambulatory Visit (HOSPITAL_COMMUNITY): Payer: PRIVATE HEALTH INSURANCE

## 2018-11-06 ENCOUNTER — Encounter: Payer: PRIVATE HEALTH INSURANCE | Admitting: Surgery

## 2019-01-01 ENCOUNTER — Ambulatory Visit (INDEPENDENT_AMBULATORY_CARE_PROVIDER_SITE_OTHER): Payer: PRIVATE HEALTH INSURANCE | Admitting: Surgery

## 2019-01-01 ENCOUNTER — Other Ambulatory Visit: Payer: Self-pay

## 2019-01-01 ENCOUNTER — Encounter: Payer: Self-pay | Admitting: Surgery

## 2019-01-01 ENCOUNTER — Ambulatory Visit (HOSPITAL_COMMUNITY)
Admission: RE | Admit: 2019-01-01 | Discharge: 2019-01-01 | Disposition: A | Discharge status: Home / Self Care | Payer: PRIVATE HEALTH INSURANCE | Source: Ambulatory Visit | Attending: Surgery | Admitting: Surgery

## 2019-01-01 VITALS — BP 141/93 | HR 76 | Temp 97.2°F | Resp 20 | Wt 146.0 lb

## 2019-01-01 DIAGNOSIS — I83891 Varicose veins of right lower extremities with other complications: Secondary | ICD-10-CM | POA: Insufficient documentation

## 2019-01-01 NOTE — Progress Notes (Signed)
[facility]  Patient name: Stephanie CoyHana Fuller MRN: [id number] DOB: 1961-08-28 Sex: female   REQUESTING PROVIDER:    Dr. Docia ChuckKoirala   REASON FOR CONSULT:    Varicose veins  HISTORY OF PRESENT ILLNESS:   Stephanie Fuller is a 58 y.o. female, who is referred today for evaluation of varicose veins.  The patient states that she has had her veins for a while but they have become increasingly more painful and more prominent.  This summer she was afraid to wear shorts because of how much they bulged out and because of how painful they were.  She states that the pain is continuous.  She does not have much swelling.  She has not worn compression stockings.  PAST MEDICAL HISTORY    Past Medical History:  Diagnosis Date  . No pertinent past medical history      FAMILY HISTORY   Family History  Problem Relation Age of Onset  . Diabetes Mellitus I Mother   . Coronary artery disease Mother   . Brain cancer Father   . Breast cancer Neg Hx     SOCIAL HISTORY:   Social History   Socioeconomic History  . Marital status: Married    Spouse name: Not on file  . Number of children: Not on file  . Years of education: Not on file  . Highest education level: Not on file  Occupational History  . Not on file  Social Needs  . Financial resource strain: Not on file  . Food insecurity:    Worry: Not on file    Inability: Not on file  . Transportation needs:    Medical: Not on file    Non-medical: Not on file  Tobacco Use  . Smoking status: Current Every Day Smoker    Packs/day: 1.50    Years: 15.00    Pack years: 22.50    Types: Cigarettes  . Smokeless tobacco: Never Used  . Tobacco comment: 4-5 cigarettes per day since age 58  Substance and Sexual Activity  . Alcohol use: Yes    Alcohol/week: 1.0 standard drinks    Types: 1 Glasses of wine per week  . Drug use: No  . Sexual activity: Not on file  Lifestyle  .  Physical activity:    Days per week: Not on file    Minutes per session: Not on file  . Stress: Not on file  Relationships  . Social connections:    Talks on phone: Not on file    Gets together: Not on file    Attends religious service: Not on file    Active member of club or organization: Not on file    Attends meetings of clubs or organizations: Not on file    Relationship status: Not on file  . Intimate partner violence:    Fear of current or ex partner: Not on file    Emotionally abused: Not on file    Physically abused: Not on file    Forced sexual activity: Not on file  Other Topics Concern  . Not on file  Social History Narrative   [place]    ALLERGIES:    No Known Allergies  CURRENT MEDICATIONS:    Current Outpatient Medications  Medication Sig Dispense Refill  . aspirin 81 MG tablet Take 81 mg by mouth daily.    Marland Kitchen. lactulose (CEPHULAC) 10 G packet Take 10 g by mouth 2 (two) times daily.    .Marland Kitchen  metFORMIN (GLUCOPHAGE) 500 MG tablet Take 1 tablet (500 mg total) by mouth 2 (two) times daily with a meal. 60 tablet 0   No current facility-administered medications for this visit.     REVIEW OF SYSTEMS:   [X]  denotes positive finding, [ ]  denotes negative finding Cardiac  Comments:  Chest pain or chest pressure:    Shortness of breath upon exertion:    Short of breath when lying flat:    Irregular heart rhythm:        Vascular    Pain in calf, thigh, or hip brought on by ambulation:    Pain in feet at night that wakes you up from your sleep:     Blood clot in your veins:    Leg swelling:         Pulmonary    Oxygen at home:    Productive cough:     Wheezing:         Neurologic    Sudden weakness in arms or legs:     Sudden numbness in arms or legs:     Sudden onset of difficulty speaking or slurred speech:    Temporary loss of vision in one eye:     Problems with dizziness:         Gastrointestinal    Blood in stool:       Vomited blood:         Genitourinary    Burning when urinating:     Blood in urine:        Psychiatric    Major depression:         Hematologic    Bleeding problems:    Problems with blood clotting too easily:        Skin    Rashes or ulcers:        Constitutional    Fever or chills:     PHYSICAL EXAM:   Vitals:   01/01/19 1443  BP: (!) 141/93  Pulse: 76  Resp: 20  Temp: (!) 97.2 F (36.2 C)  SpO2: 99%  Weight: 146 lb (66.2 kg)    GENERAL: The patient is a well-nourished female, in no acute distress. The vital signs are documented above. CARDIAC: There is a regular rate and rhythm.  VASCULAR: Prominent bulging varicosity on the medial right leg at the level of the knee.  Also an area of fullness in the mid thigh medially.  Minimal edema. PULMONARY: Nonlabored respirations MUSCULOSKELETAL: There are no major deformities or cyanosis. NEUROLOGIC: No focal weakness or paresthesias are detected. SKIN: There are no ulcers or rashes noted. PSYCHIATRIC: The patient has a normal affect.  STUDIES:   I have ordered and reviewed her vascular lab studies with the following findings:  Venous Reflux Times Normal value < 0.5 sec +------------------------------+----------+---------+                               Right (ms)Left (ms) +------------------------------+----------+---------+ CFV                           3051.00   1262.00   +------------------------------+----------+---------+ GSV at Saphenofemoral junction2721.00             +------------------------------+----------+---------+ GSV prox thigh                2868.00             +------------------------------+----------+---------+ GSV mid  thigh                 2707.00             +------------------------------+----------+---------+ GSV dist thigh                2861.00             +------------------------------+----------+---------+ GSV at knee                   3235.00              +------------------------------+----------+---------+ SSV origin                    103.00              +------------------------------+----------+---------+ SSV prox                      73.30               +------------------------------+----------+---------+  Vein Diameters: +------------------------------+----------+---------+                               Right (cm)Left (cm) +------------------------------+----------+---------+ GSV at Saphenofemoral junction0.832               +------------------------------+----------+---------+ GSV at prox thigh             0.493               +------------------------------+----------+---------+ GSV at mid thigh              0.455               +------------------------------+----------+---------+ GSV at distal thigh           0.863               +------------------------------+----------+---------+ GSV at knee                   0.451               +------------------------------+----------+---------+ GSV prox calf                 0.571               +------------------------------+----------+---------+ GSV mid calf                  0.481               +------------------------------+----------+---------+ GSV dist calf                                     +------------------------------+----------+---------+ SSV origin                    0.274               +------------------------------+----------+---------+ SSV prox                      0.250               +------------------------------+----------+---------+ SSV mid                       0.374               +------------------------------+----------+---------+    Right Reflux Technical Findings: No evidence of  DVT, SVT, or Baker's cyst. The sapheno-femoral junction is incompetent. GSV demonstrates reflux from the junction in the calf. No evidence of SSV reflux. The common femoral vein demonstrates reflux.    Summary: Right: No reflux was noted in the femoral vein in the thigh, popliteal vein, origin of the small saphenous vein, and proximal small saphenous vein. Abnormal reflux times were noted in the common femoral vein, great saphenous vein at the saphenofemoral  junction, great saphenous vein at the proximal thigh, great saphenous vein at the mid thigh, great saphenous vein at the distal thigh, and great saphenous vein at the knee. Evidence of chronic venous insufficiency is detected in the great saphenous vein,  and deep venous system. There is no evidence of deep vein thrombosis in the lower extremity. There is no evidence of superficial venous thrombosis. No cystic structure found in the popliteal fossa. Left: No evidence of common femoral vein obstruction. Abnormal reflux times were noted in the common femoral vein.  ASSESSMENT and PLAN   Painful varicose veins: The patient has had varicosities for a while however recently they have been coming more more prominent as well as painful.  These are very bothersome to her.  I have recommended that we initially treat her with 20-30 thigh-high compression stockings.  Her venous reflux ultrasound today shows that she does have significant reflux in the right great saphenous vein.  I am going to have her come back in 3 months for evaluation in the vein center for additional treatment options.  All the patient's questions were answered today and she will follow-up in 3 months.   Durene Cal, MD [facility] [phone] Pager [phone]

## 2019-04-04 ENCOUNTER — Ambulatory Visit: Payer: PRIVATE HEALTH INSURANCE | Admitting: Vascular Surgery

## 2019-08-02 ENCOUNTER — Ambulatory Visit: Payer: PRIVATE HEALTH INSURANCE | Admitting: Vascular Surgery

## 2019-09-06 ENCOUNTER — Ambulatory Visit: Payer: Self-pay | Admitting: Vascular Surgery

## 2019-10-23 ENCOUNTER — Other Ambulatory Visit: Payer: Self-pay

## 2019-10-23 ENCOUNTER — Ambulatory Visit: Payer: Self-pay | Admitting: Registered Nurse

## 2019-10-23 VITALS — BP 123/77 | HR 66 | Temp 97.1°F

## 2019-10-23 DIAGNOSIS — S29012A Strain of muscle and tendon of back wall of thorax, initial encounter: Secondary | ICD-10-CM

## 2019-10-23 DIAGNOSIS — S46912A Strain of unspecified muscle, fascia and tendon at shoulder and upper arm level, left arm, initial encounter: Secondary | ICD-10-CM

## 2019-10-23 MED ORDER — BIOFREEZE 4 % EX GEL: 1.0000 "application " | Freq: Four times a day (QID) | CUTANEOUS | Status: AC | PRN

## 2019-10-23 MED ORDER — IBUPROFEN 800 MG PO TABS: 800.0000 mg | ORAL_TABLET | Freq: Three times a day (TID) | ORAL | 0 refills | Status: AC | PRN

## 2019-10-23 NOTE — Patient Instructions (Signed)
Shoulder Exercises Ask your health care provider which exercises are safe for you. Do exercises exactly as told by your health care provider and adjust them as directed. It is normal to feel mild stretching, pulling, tightness, or discomfort as you do these exercises. Stop right away if you feel sudden pain or your pain gets worse. Do not begin these exercises until told by your health care provider. Stretching exercises External rotation and abduction This exercise is sometimes called corner stretch. This exercise rotates your arm outward (external rotation) and moves your arm out from your body (abduction). 1. Stand in a doorway with one of your feet slightly in front of the other. This is called a staggered stance. If you cannot reach your forearms to the door frame, stand facing a corner of a room. 2. Choose one of the following positions as told by your health care provider: ? Place your hands and forearms on the door frame above your head. ? Place your hands and forearms on the door frame at the height of your head. ? Place your hands on the door frame at the height of your elbows. 3. Slowly move your weight onto your front foot until you feel a stretch across your chest and in the front of your shoulders. Keep your head and chest upright and keep your abdominal muscles tight. 4. Hold for ____15______ seconds. 5. To release the stretch, shift your weight to your back foot. Repeat ____3______ times. Complete this exercise _____3_____ times a day. Extension, standing 1. Stand and hold a broomstick, a cane, or a similar object behind your back. ? Your hands should be a little wider than shoulder width apart. ? Your palms should face away from your back. 2. Keeping your elbows straight and your shoulder muscles relaxed, move the stick away from your body until you feel a stretch in your shoulders (extension). ? Avoid shrugging your shoulders while you move the stick. Keep your shoulder blades  tucked down toward the middle of your back. 3. Hold for _____15_____ seconds. 4. Slowly return to the starting position. Repeat ____3______ times. Complete this exercise _____3_____ times a day. Range-of-motion exercises Pendulum  1. Stand near a wall or a surface that you can hold onto for balance. 2. Bend at the waist and let your left / right arm hang straight down. Use your other arm to support you. Keep your back straight and do not lock your knees. 3. Relax your left / right arm and shoulder muscles, and move your hips and your trunk so your left / right arm swings freely. Your arm should swing because of the motion of your body, not because you are using your arm or shoulder muscles. 4. Keep moving your hips and trunk so your arm swings in the following directions, as told by your health care provider: ? Side to side. ? Forward and backward. ? In clockwise and counterclockwise circles. 5. Continue each motion for ____15______ seconds, or for as long as told by your health care provider. 6. Slowly return to the starting position. Repeat _____3_____ times. Complete this exercise ____3______ times a day. Shoulder flexion, standing  1. Stand and hold a broomstick, a cane, or a similar object. Place your hands a little more than shoulder width apart on the object. Your left / right hand should be palm up, and your other hand should be palm down. 2. Keep your elbow straight and your shoulder muscles relaxed. Push the stick up with your healthy arm to  raise your left / right arm in front of your body, and then over your head until you feel a stretch in your shoulder (flexion). ? Avoid shrugging your shoulder while you raise your arm. Keep your shoulder blade tucked down toward the middle of your back. 3. Hold for ____15______ seconds. 4. Slowly return to the starting position. Repeat _____3 three times per day Shoulder abduction, standing 1. Stand and hold a broomstick, a cane, or a similar  object. Place your hands a little more than shoulder width apart on the object. Your left / right hand should be palm up, and your other hand should be palm down. 2. Keep your elbow straight and your shoulder muscles relaxed. Push the object across your body toward your left / right side. Raise your left / right arm to the side of your body (abduction) until you feel a stretch in your shoulder. ? Do not raise your arm above shoulder height unless your health care provider tells you to do that. ? If directed, raise your arm over your head. ? Avoid shrugging your shoulder while you raise your arm. Keep your shoulder blade tucked down toward the middle of your back. 3. Hold for ___15_______ seconds. 4. Slowly return to the starting position. Repeat ______3____ times. Complete this exercise ___3_______ times a day. Internal rotation  1. Place your left / right hand behind your back, palm up. 2. Use your other hand to dangle an exercise band, a towel, or a similar object over your shoulder. Grasp the band with your left / right hand so you are holding on to both ends. 3. Gently pull up on the band until you feel a stretch in the front of your left / right shoulder. The movement of your arm toward the center of your body is called internal rotation. ? Avoid shrugging your shoulder while you raise your arm. Keep your shoulder blade tucked down toward the middle of your back. 4. Hold for _____15_____ seconds. 5. Release the stretch by letting go of the band and lowering your hands. Repeat ____3______ times. Complete this exercise ____3______ times a day. Strengthening exercises External rotation  1. Sit in a stable chair without armrests. 2. Secure an exercise band to a stable object at elbow height on your left / right side. 3. Place a soft object, such as a folded towel or a small pillow, between your left / right upper arm and your body to move your elbow about 4 inches (10 cm) away from your side.  4. Hold the end of the exercise band so it is tight and there is no slack. 5. Keeping your elbow pressed against the soft object, slowly move your forearm out, away from your abdomen (external rotation). Keep your body steady so only your forearm moves. 6. Hold for ___15_______ seconds. 7. Slowly return to the starting position. Repeat ____3____ times. Complete this exercise ______3____ times a day. Shoulder abduction  1. Sit in a stable chair without armrests, or stand up. 2. Hold a _____none_____ weight in your left / right hand, or hold an exercise band with both hands. 3. Start with your arms straight down and your left / right palm facing in, toward your body. 4. Slowly lift your left / right hand out to your side (abduction). Do not lift your hand above shoulder height unless your health care provider tells you that this is safe. ? Keep your arms straight. ? Avoid shrugging your shoulder while you do this movement. Keep  your shoulder blade tucked down toward the middle of your back. 5. Hold for ______15____ seconds. 6. Slowly lower your arm, and return to the starting position. Repeat ____3______ times. Complete this exercise ____3______ times a day. Shoulder extension 1. Sit in a stable chair without armrests, or stand up. 2. Secure an exercise band to a stable object in front of you so it is at shoulder height. 3. Hold one end of the exercise band in each hand. Your palms should face each other. 4. Straighten your elbows and lift your hands up to shoulder height. 5. Step back, away from the secured end of the exercise band, until the band is tight and there is no slack. 6. Squeeze your shoulder blades together as you pull your hands down to the sides of your thighs (extension). Stop when your hands are straight down by your sides. Do not let your hands go behind your body. 7. Hold for ______15___ seconds. 8. Slowly return to the starting position. Repeat ____3______ times. Complete  this exercise _____3_____ times a day. Shoulder row 1. Sit in a stable chair without armrests, or stand up. 2. Secure an exercise band to a stable object in front of you so it is at waist height. 3. Hold one end of the exercise band in each hand. Position your palms so that your thumbs are facing the ceiling (neutral position). 4. Bend each of your elbows to a 90-degree angle (right angle) and keep your upper arms at your sides. 5. Step back until the band is tight and there is no slack. 6. Slowly pull your elbows back behind you. 7. Hold for ____15______ seconds. 8. Slowly return to the starting position. Repeat ___3_______ times. Complete this exercise _____3_____ times a day. Shoulder press-ups  1. Sit in a stable chair that has armrests. Sit upright, with your feet flat on the floor. 2. Put your hands on the armrests so your elbows are bent and your fingers are pointing forward. Your hands should be about even with the sides of your body. 3. Push down on the armrests and use your arms to lift yourself off the chair. Straighten your elbows and lift yourself up as much as you comfortably can. ? Move your shoulder blades down, and avoid letting your shoulders move up toward your ears. ? Keep your feet on the ground. As you get stronger, your feet should support less of your body weight as you lift yourself up. 4. Hold for ____15______ seconds. 5. Slowly lower yourself back into the chair. Repeat ____3______ times. Complete this exercise ___3_______ times a day. Wall push-ups  1. Stand so you are facing a stable wall. Your feet should be about one arm-length away from the wall. 2. Lean forward and place your palms on the wall at shoulder height. 3. Keep your feet flat on the floor as you bend your elbows and lean forward toward the wall. 4. Hold for ____15______ seconds. 5. Straighten your elbows to push yourself back to the starting position. Repeat ______3____ times. Complete this  exercise ____3______ times a day. This information is not intended to replace advice given to you by your health care provider. Make sure you discuss any questions you have with your health care provider. Document Released: 10/27/2005 Document Revised: 04/06/2019 Document Reviewed: 01/12/2019 Elsevier Patient Education  [place]. Shoulder Stretch    Stand and clasp hands in front of body. Extend arms out and push, then down and push. Feel shoulder blades stretch. Hold each  position ____ seconds. Repeat ____ times. Do ____ sessions per day.  http://gt2.exer.us/58   Copyright  VHI. All rights reserved.  Shoulder Sprain  A shoulder sprain is a partial or complete tear in one of the tough, fiber-like tissues (ligaments) in the shoulder. The ligaments in the shoulder help to hold the shoulder in place. What are the causes? This condition may be caused by:  A fall.  A hit to the shoulder.  A twist of the arm. What increases the risk? You are more likely to develop this condition if you:  Play sports.  Have problems with balance or coordination. What are the signs or symptoms? Symptoms of this condition include:  Pain when moving the shoulder.  Limited ability to move the shoulder.  Swelling and tenderness on top of the shoulder.  Warmth in the shoulder.  A change in the shape of the shoulder.  Redness or bruising on the shoulder. How is this diagnosed? This condition is diagnosed with:  A physical exam. During the exam, you may be asked to do simple exercises with your shoulder.  Imaging tests such as X-rays, MRI, or a CT scan. These tests can show how severe the sprain is. How is this treated? This condition may be treated with:  Rest.  Pain medicine.  Ice.  A sling or brace. This is used to keep the arm still while the shoulder is healing.  Physical therapy or rehabilitation exercises. These help to improve the range of motion and strength of the  shoulder.  Surgery (rare). Surgery may be needed if the sprain caused a joint to become unstable. Surgery may also be needed to reduce pain. Some people may develop ongoing shoulder pain or lose some range of motion in the shoulder. However, most people do not develop long-term problems. Follow these instructions at home:  If you have a sling or brace:  Wear the sling or brace as told by your health care provider. Remove it only as told by your health care provider.  Loosen the sling or brace if your fingers tingle, become numb, or turn cold and blue.  Keep the sling or brace clean.  If the sling or brace is not waterproof: ? Do not let it get wet. ? Cover it with a watertight covering when you take a bath or shower. Activity  Rest your shoulder.  Move your arm only as much as told by your health care provider, but move your hand and fingers often to prevent stiffness and swelling.  Return to your normal activities as told by your health care provider. Ask your health care provider what activities are safe for you.  Ask your health care provider when it is safe for you to drive if you have a sling or brace on your shoulder.  If you were shown how to do any exercises, do them as told by your health care provider. General instructions  If directed, put ice on the affected area. ? Put ice in a plastic bag. ? Place a towel between your skin and the bag. ? Leave the ice on for 20 minutes, 2-3 times a day.  Take over-the-counter and prescription medicines only as told by your health care provider.  Do not use any products that contain nicotine or tobacco, such as cigarettes, e-cigarettes, and chewing tobacco. These can delay healing. If you need help quitting, ask your health care provider.  Keep all follow-up visits as told by your health care provider. This is important. Contact  a health care provider if:  Your pain gets worse.  Your pain is not relieved with medicines.  You  have increased redness or swelling. Get help right away if:  You have a fever.  You cannot move your arm or shoulder.  You develop severe numbness or tingling in your arm, hand, or fingers.  Your arm, hand, or fingers feel cold and turn blue, white, or gray. Summary  A shoulder sprain is a partial or complete tear in one of the tough, fiber-like tissues (ligaments) in the shoulder.  This condition may be caused by a fall, a hit to the shoulder, or a twist of the arm.  Treatment usually includes rest, ice, and pain medicine as needed.  If you have a sling or brace, wear it as told by your health care provider. Remove it only as told by your health care provider. This information is not intended to replace advice given to you by your health care provider. Make sure you discuss any questions you have with your health care provider. Document Released: 05/01/2009 Document Revised: 05/19/2018 Document Reviewed: 05/19/2018 Elsevier Patient Education  2020 [employer]

## 2019-10-23 NOTE — Progress Notes (Signed)
Subjective:    Patient ID: Stephanie Fuller, female    DOB: 03/09/1961, 58 y.o.   MRN: [id number]  58y/o Caucasian female pt c/o L shoulder and L ribcage/thoracic pain x3-4 days. Pain at top of shoulder, clavicle, shoulder joint and from underarm down to L breast.  States no known acute injury. Pain gradually started and worsened over the weekend. Pain is always worst upon waking in morning. Hurts to take a deep breath, pushing, pulling or bending down. Pt given packets of Biofreeze and Ibuprofen 200mg  from clinic OTC stock yesterday. Instructed to take Ibuprofen 800mg  po q8h prn pain. Has not taken any yet today but states pain is improved this morning, not as tight or sore with biofreeze and motrin.  Denied dropping things but some weakness noted when opening door knobs.  Right hand dominant.  Hx fracture right.  Pain dull ache/throbbing denied sharp or stabbing.  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  Sitting now more than standing.  Denied rash/bruising/swelling/trauma/locking or popping or area around shoulder/chest.     Review of Systems  Constitutional: Positive for activity change. Negative for appetite change, chills, diaphoresis, fatigue and fever.  HENT: Negative for trouble swallowing and voice change.   Eyes: Negative for photophobia and visual disturbance.  Respiratory: Negative for cough, chest tightness, shortness of breath, wheezing and stridor.   Cardiovascular: Negative for palpitations.  Gastrointestinal: Negative for diarrhea, nausea and vomiting.  Endocrine: Negative for cold intolerance and heat intolerance.  Genitourinary: Negative for difficulty urinating.  Musculoskeletal: Positive for myalgias. Negative for gait problem, joint swelling and neck stiffness.  Allergic/Immunologic: Positive for immunocompromised state. Negative for food allergies.   Neurological: Positive for weakness. Negative for dizziness, tremors, seizures, syncope, facial asymmetry, speech difficulty, light-headedness, numbness and headaches.  Hematological: Negative for adenopathy. Does not bruise/bleed easily.  Psychiatric/Behavioral: Negative for agitation, confusion and sleep disturbance.       Objective:   Physical Exam Vitals signs and nursing note reviewed.  Constitutional:      General: She is awake.     Appearance: Normal appearance. She is well-developed and well-groomed. She is not ill-appearing, toxic-appearing or diaphoretic.  HENT:     Head: Normocephalic and atraumatic.     Jaw: There is normal jaw occlusion.     Salivary Glands: Right salivary gland is not diffusely enlarged or tender. Left salivary gland is not diffusely enlarged or tender.     Right Ear: External ear normal.     Left Ear: External ear normal.     Nose: Nose normal.     Mouth/Throat:     Mouth: Mucous membranes are moist.     Pharynx: Oropharynx is clear.  Eyes:     General: Lids are normal. Vision grossly intact. Gaze aligned appropriately. No visual field deficit.    Extraocular Movements: Extraocular movements intact.     Conjunctiva/sclera: Conjunctivae normal.     Pupils: Pupils are equal, round, and reactive to light.  Neck:     Musculoskeletal: Normal range of motion and neck supple. Normal range of motion. Pain with movement and muscular tenderness present. No edema, erythema, neck rigidity, crepitus, injury, torticollis or spinous process tenderness.     Thyroid: No thyroid mass, thyromegaly or thyroid tenderness.     Trachea: Trachea normal.  Cardiovascular:     Rate and Rhythm: Normal rate and regular rhythm.     Pulses:  Normal pulses.          Radial pulses are 2+ on the right side and 2+ on the left side.     Heart sounds: Normal heart sounds.  Pulmonary:     Effort: Pulmonary effort is normal. No respiratory distress.     Breath sounds: Normal breath  sounds and air entry. No stridor, decreased air movement or transmitted upper airway sounds. No decreased breath sounds, wheezing, rhonchi or rales.     Comments: Wearing cloth mask due to covid 19 pandemic; spoke full sentences without difficulty; no cough observed in exam room Abdominal:     General: Abdomen is flat.  Musculoskeletal:        General: Tenderness present. No swelling or deformity.     Right shoulder: Normal.     Left shoulder: She exhibits tenderness and pain. She exhibits normal range of motion, no bony tenderness, no swelling, no effusion, no crepitus, no deformity, no laceration, no spasm, normal pulse and normal strength.     Right elbow: Normal.    Left elbow: Normal.     Right hip: Normal.     Left hip: Normal.     Right knee: Normal.     Left knee: Normal.     Cervical back: She exhibits tenderness, pain and spasm. She exhibits normal range of motion, no bony tenderness, no swelling, no edema, no deformity, no laceration and normal pulse.     Thoracic back: She exhibits tenderness and pain. She exhibits normal range of motion, no bony tenderness, no swelling, no edema, no deformity, no laceration, no spasm and normal pulse.     Lumbar back: Normal.       Back:     Right hand: Normal.     Left hand: Normal.     Right lower leg: No edema.     Left lower leg: No edema.     Comments: Bilateral trapezius tight;full AROM without pain c/t/l-spine; left latissmus dorsi posterior and midaxillary line TTP and mid scapular soft tissue TTP left; Pain with shoulder left abduction and  Sustained holding hands straight in front of body at shoulder level; negative empty beer can/cross body/atchley scratch/neers tests bilaterally; bilateral hand grasp and upper and lower extremity strength equal 5/5  Lymphadenopathy:     Head:     Right side of head: No submental, submandibular, tonsillar, preauricular, posterior auricular or occipital adenopathy.     Left side of head: No  submental, submandibular, tonsillar, preauricular, posterior auricular or occipital adenopathy.     Cervical: No cervical adenopathy.     Right cervical: No superficial cervical adenopathy.    Left cervical: No superficial cervical adenopathy.  Skin:    General: Skin is warm and dry.     Capillary Refill: Capillary refill takes less than 2 seconds.     Coloration: Skin is not ashen, cyanotic, jaundiced, mottled, pale or sallow.     Findings: No abrasion, abscess, acne, bruising, burn, ecchymosis, erythema, laceration, lesion, petechiae, rash or wound.     Nails: There is no clubbing.   Neurological:     General: No focal deficit present.     Mental Status: She is alert and oriented to person, place, and time. Mental status is at baseline.     GCS: GCS eye subscore is 4. GCS verbal subscore is 5. GCS motor subscore is 6.     Cranial Nerves: Cranial nerves are intact. No cranial nerve deficit, dysarthria or facial asymmetry.  Sensory: Sensation is intact. No sensory deficit.     Motor: Motor function is intact. No weakness, tremor, atrophy, abnormal muscle tone or seizure activity.     Coordination: Coordination is intact. Coordination normal.     Gait: Gait is intact. Gait normal.     Comments: Gait sure and steady in clinic; on/off exam table and in/out of chair without difficulty; upper and lower extremities equal strength 5/5  Psychiatric:        Attention and Perception: Attention and perception normal.        Mood and Affect: Mood and affect normal.        Speech: Speech normal.        Behavior: Behavior normal. Behavior is cooperative.        Thought Content: Thought content normal.        Cognition and Memory: Cognition and memory normal.        Judgment: Judgment normal.           Assessment & Plan:  A-initial encounter left latissmus dorsi strain and scapular strain  [employer] dispensed trial motrin and biofreeze and patient reported they helped a great deal  yesterday.  Add exercises and extend use for two weeks daily at least.  Discussed holding items/even telephone at full arm extension away from body strains neck/core/scapula.  Discussed ergonomics of workstation to help prevent injury/strain.  Exitcare handouts printed and given on shoulder sports rehab exercises, shoulder stretch and shoulder strain.  Patient to do exercises 15 seconds 3 reps TID and follow up in 2 weeks.  Motrin 800mg  po TID prn pain dispensed from pdrx to patient #30 RF0.  Emphasized to take with food to help prevent gastritis.  And biofreeze gel 6 UD given to patient from clinic stock apply QID topical PRN pain.  Consider cryotherapy/heat therapy 15 minutes QID prn pain/muscle tightness/soreness.  Keep items she is  working with close to body not reaching across work table/switch to other side of work table when one side full instead of leaning and reaching for extended periods of time.  Discussed the change in how she is moving/standing in place and seated reaching now instead of more walking has changed which muscle groups are getting worked.  Frequent repetitive motions with unboxing.  She needs to do recommended stretches at work.  Demonstrated arm pendulums/c-spine arom; trapezius pinch, using hand towel to stretch trapezius, spiders up wall, using broomstick for shoulder arom assist.  Discussed if 15 seconds too painful can start at 5 second duration 3 reps TID.  Follow up in 2 weeks for re-evaluation sooner if new or worsening symptoms.  Patient verbalized understanding information/instructions, agreed with plan of care and had no further questions at this time.

## 2022-01-13 ENCOUNTER — Other Ambulatory Visit: Payer: Self-pay | Admitting: Family Medicine

## 2022-01-13 DIAGNOSIS — Z1231 Encounter for screening mammogram for malignant neoplasm of breast: Secondary | ICD-10-CM

## 2022-01-19 ENCOUNTER — Other Ambulatory Visit: Payer: Self-pay

## 2022-01-19 ENCOUNTER — Ambulatory Visit
Admission: RE | Admit: 2022-01-19 | Discharge: 2022-01-19 | Disposition: A | Discharge status: Home / Self Care | Payer: No Typology Code available for payment source | Source: Ambulatory Visit | Attending: Family Medicine | Admitting: Family Medicine

## 2022-01-19 DIAGNOSIS — Z1231 Encounter for screening mammogram for malignant neoplasm of breast: Secondary | ICD-10-CM

## 2022-04-02 ENCOUNTER — Encounter: Payer: Self-pay | Admitting: *Deleted

## 2022-04-02 ENCOUNTER — Telehealth: Payer: Self-pay | Admitting: *Deleted

## 2022-04-02 DIAGNOSIS — S60461A Insect bite (nonvenomous) of left index finger, initial encounter: Secondary | ICD-10-CM

## 2022-04-02 DIAGNOSIS — W57XXXA Bitten or stung by nonvenomous insect and other nonvenomous arthropods, initial encounter: Secondary | ICD-10-CM

## 2022-04-02 NOTE — Telephone Encounter (Signed)
Late entry:  ?Pt's supervisor notified RN that pt was stung by a wasp at workstation. Supervisor going to send pt to clinic after she returned from restroom but supervisor called and reported pt declined evaluation citing she is not allergic to wasps or bees and felt fine.  ?RN saw pt at workstation later that morning and pt again reported that she felt fine, had no allergy and is often stung or bitten when working outside at home. Pt points to L index finger and a very small pinpoint mark at base of finger. No stinger or sac noted. Pt rinsed and cleaned area and applied ice. Mild swelling present with slight erythema where ice pack was present on skin. Pt speaking in complete sentences, no wheezing or other respiratory distress noted. Pt denies needs or concerns.  ? ?Today: ?RN saw pt at workstation. No swelling or erythema noted today. Pt points to site of sting but unable to visualize any abnormality today. Pt feels well and denies any needs or concerns.  ?

## 2022-04-02 NOTE — Telephone Encounter (Signed)
Notified by Gillis Santa Safety officer patient was stung by insect yesterday.  Asked RN Rolly Salter to follow up to patient.  Patient not having signs of infection/allergic reaction.  Reviewed RN Rolly Salter note agreed with plan of care.  Encounter closed ?

## 2022-10-27 IMAGING — MG MM DIGITAL SCREENING BILAT W/ TOMO AND CAD
8 series · 9 of 24 positions shown · non-contrast
Comparison: Previous exam(s).

CLINICAL DATA: Screening.

EXAM:
DIGITAL SCREENING BILATERAL MAMMOGRAM WITH TOMOSYNTHESIS AND CAD
TECHNIQUE: Bilateral screening digital craniocaudal and mediolateral oblique
mammograms were obtained. Bilateral screening digital breast
tomosynthesis was performed. The images were evaluated with
computer-aided detection.

[L CC synth-2D]
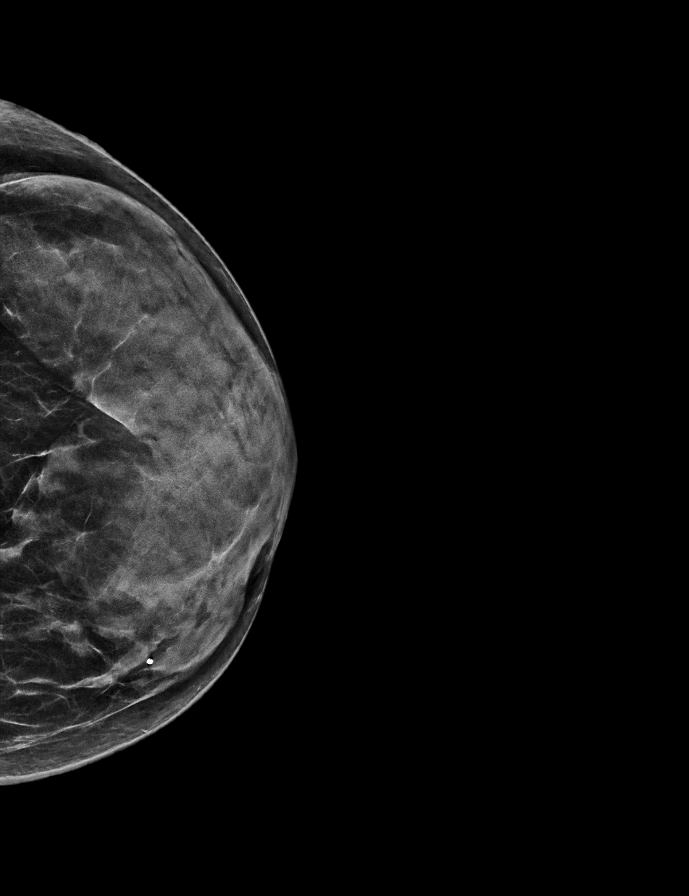

[L MLO synth-2D]
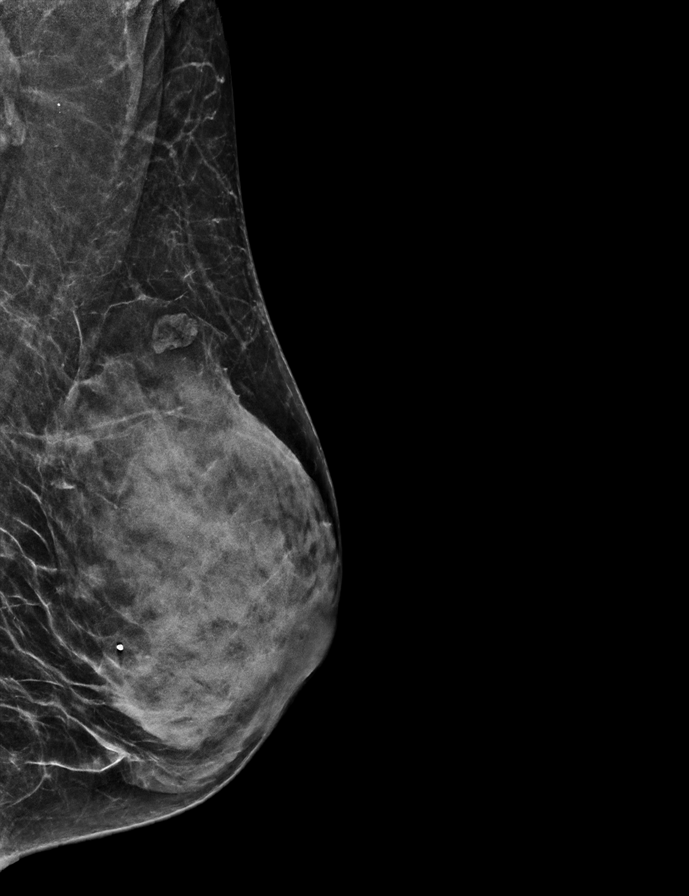

[R CC synth-2D]
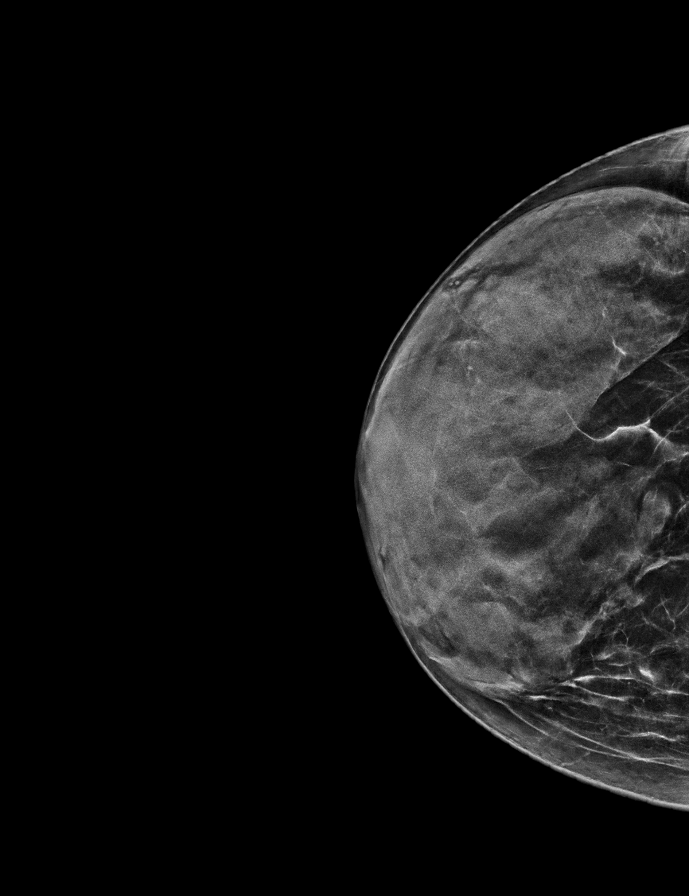

[R MLO synth-2D]
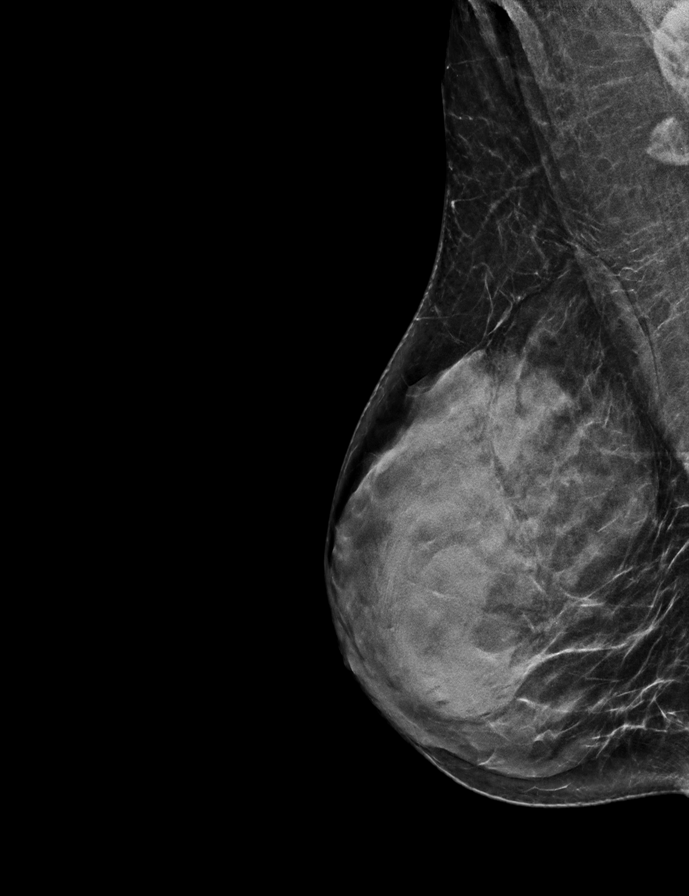

[L CC tomo · 2 of 54 frames shown]
[frame 18/54]
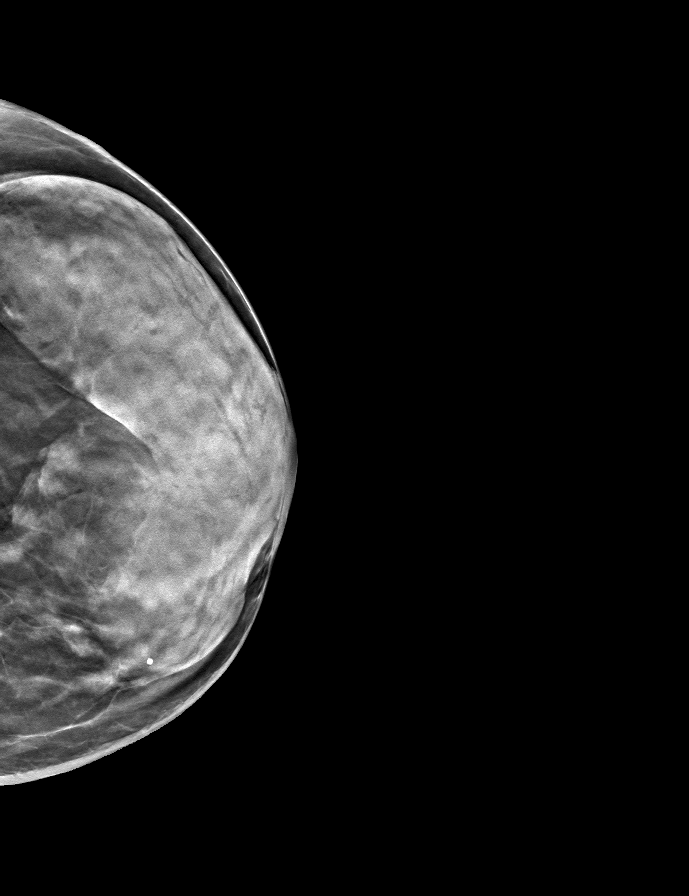
[frame 27/54]
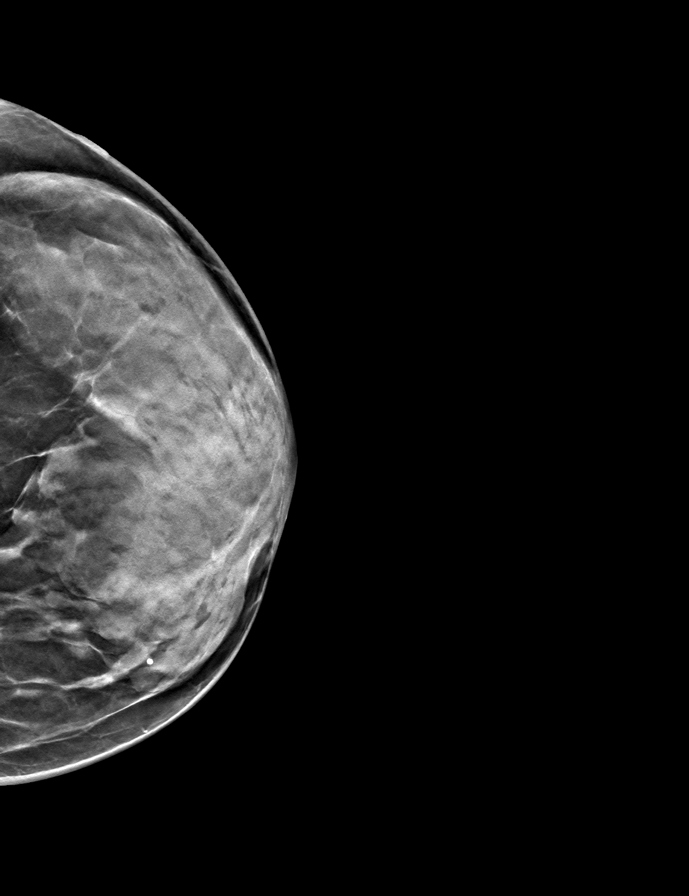

[R CC tomo · tomo slice 27/54.0]
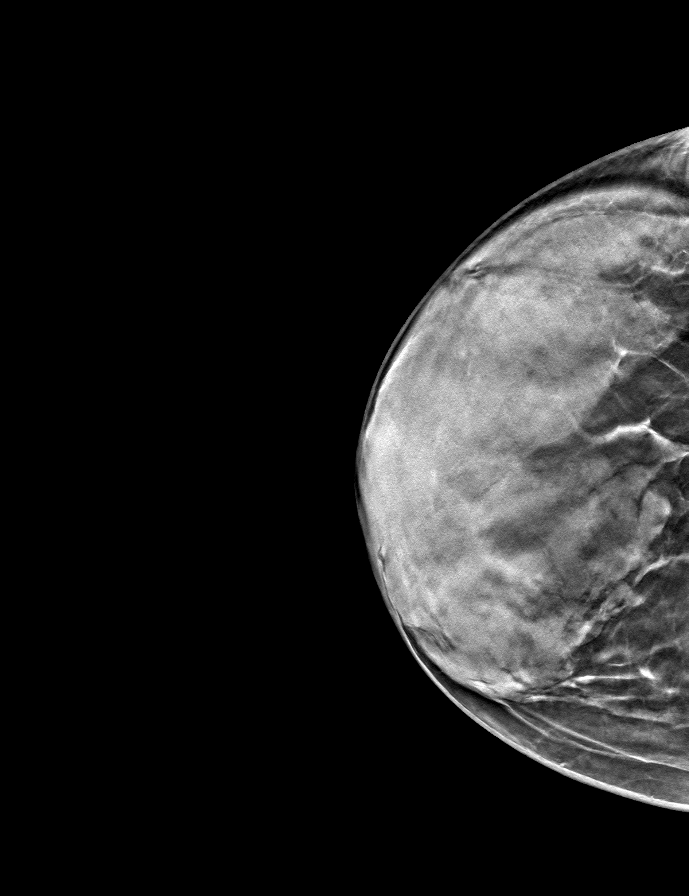

[L MLO tomo · tomo slice 27/53.0]
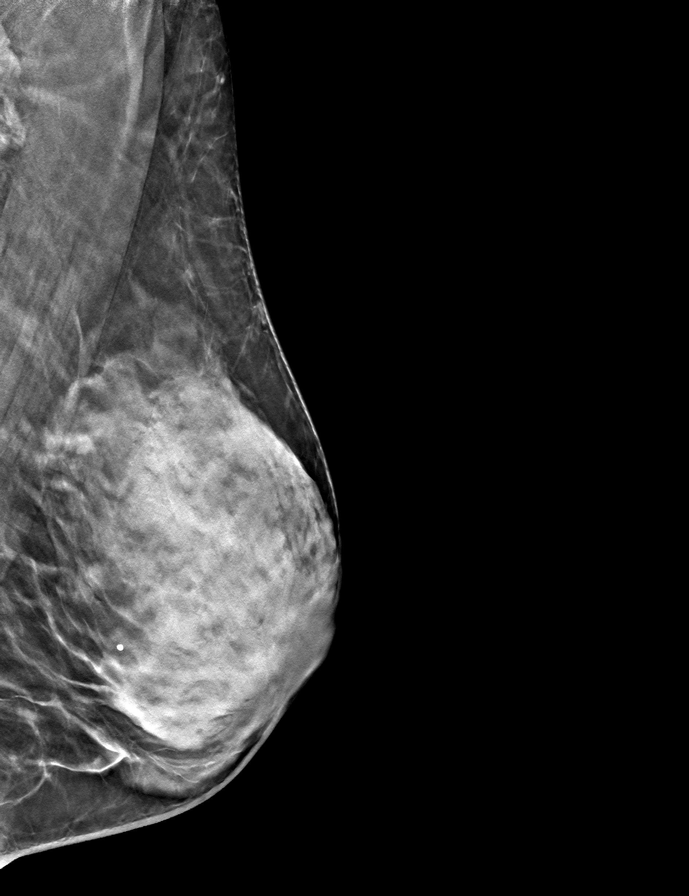

[R MLO tomo · tomo slice 29/58.0]
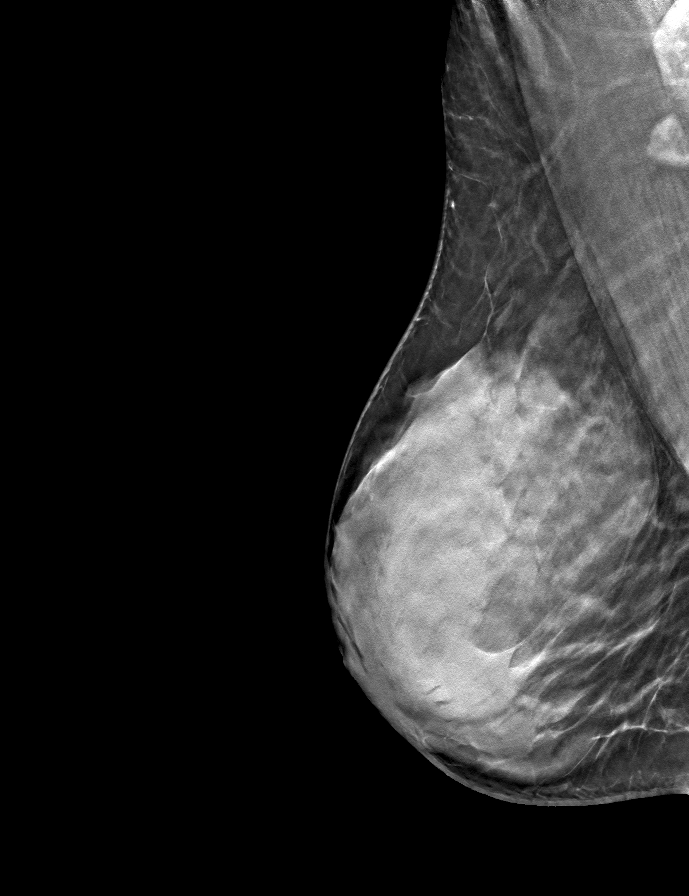

[9 of 24 positions shown; findings below may reference images not displayed]

ACR Breast Density Category d: The breast tissue is extremely dense,
which lowers the sensitivity of mammography
FINDINGS: There are no findings suspicious for malignancy.
IMPRESSION: No mammographic evidence of malignancy. A result letter of this
screening mammogram will be mailed directly to the patient.

RECOMMENDATION:
Screening mammogram in one year. (Code:TA-V-WV9)

BI-RADS CATEGORY  1: Negative.

## 2023-02-17 ENCOUNTER — Other Ambulatory Visit (HOSPITAL_COMMUNITY)
Admission: RE | Admit: 2023-02-17 | Discharge: 2023-02-17 | Disposition: A | Discharge status: Home / Self Care | Payer: No Typology Code available for payment source | Source: Ambulatory Visit | Attending: Family Medicine | Admitting: Family Medicine

## 2023-02-17 ENCOUNTER — Other Ambulatory Visit: Payer: Self-pay | Admitting: Family Medicine

## 2023-02-17 DIAGNOSIS — Z01411 Encounter for gynecological examination (general) (routine) with abnormal findings: Secondary | ICD-10-CM | POA: Insufficient documentation

## 2023-02-22 LAB — CYTOLOGY - PAP
Comment: NEGATIVE
Diagnosis: NEGATIVE
High risk HPV: NEGATIVE

## 2023-05-11 ENCOUNTER — Ambulatory Visit: Payer: No Typology Code available for payment source | Admitting: Occupational Medicine

## 2023-05-11 DIAGNOSIS — S50361A Insect bite (nonvenomous) of right elbow, initial encounter: Secondary | ICD-10-CM

## 2023-05-11 NOTE — Progress Notes (Signed)
Pt reports yesterday unpacking boxes thinks something bite her right inner elbow. Rash red no itching. Applied triple ABT and Band-Aid.  If not improving will see Inetta Fermo NP tomorrow.

## 2023-12-01 ENCOUNTER — Ambulatory Visit: Payer: No Typology Code available for payment source | Admitting: *Deleted

## 2023-12-01 VITALS — BP 160/90 | HR 75 | Resp 20

## 2023-12-01 DIAGNOSIS — R002 Palpitations: Secondary | ICD-10-CM | POA: Insufficient documentation

## 2023-12-01 NOTE — Progress Notes (Signed)
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 Stated she had discomfort under her left breast. Denied chest pain, left arm pain, and dizziness. Vitals taken and P- 75 and regular upon auscultation. BP- 160/90. Stated she left her BP med at home. I talked with her for a bit to calm her down and take slow deep breaths. The couple had recently lost their son. I instructed for her to go home and take BP med immediately and get some rest. Instructed to go to ED if new symptoms of chest pain or increased shortness of breath. Husband to escort her home. Supervisor notified. Return to clinic prn.

## 2024-01-25 ENCOUNTER — Ambulatory Visit
Admission: RE | Admit: 2024-01-25 | Discharge: 2024-01-25 | Disposition: A | Discharge status: Home / Self Care | Payer: No Typology Code available for payment source | Source: Ambulatory Visit | Attending: Family Medicine | Admitting: Family Medicine

## 2024-01-25 ENCOUNTER — Other Ambulatory Visit: Payer: Self-pay | Admitting: Family Medicine

## 2024-01-25 DIAGNOSIS — Z1231 Encounter for screening mammogram for malignant neoplasm of breast: Secondary | ICD-10-CM
